# Patient Record
Sex: Male | Born: 1955 | Race: Black or African American | Hispanic: No | Marital: Married | Smoking: Never smoker
Health system: Southern US, Community
[De-identification: ages and names within clinical notes are randomized; demographics above are authoritative.]

## PROBLEM LIST (undated history)

## (undated) DIAGNOSIS — K08109 Complete loss of teeth, unspecified cause, unspecified class: Secondary | ICD-10-CM

## (undated) DIAGNOSIS — Z972 Presence of dental prosthetic device (complete) (partial): Secondary | ICD-10-CM

## (undated) DIAGNOSIS — E119 Type 2 diabetes mellitus without complications: Secondary | ICD-10-CM

## (undated) DIAGNOSIS — G629 Polyneuropathy, unspecified: Secondary | ICD-10-CM

## (undated) DIAGNOSIS — E785 Hyperlipidemia, unspecified: Secondary | ICD-10-CM

## (undated) DIAGNOSIS — I10 Essential (primary) hypertension: Secondary | ICD-10-CM

## (undated) DIAGNOSIS — K429 Umbilical hernia without obstruction or gangrene: Secondary | ICD-10-CM

## (undated) DIAGNOSIS — Z973 Presence of spectacles and contact lenses: Secondary | ICD-10-CM

## (undated) HISTORY — PX: ELBOW ARTHROSCOPY: SUR87

## (undated) HISTORY — DX: Hyperlipidemia, unspecified: E78.5

## (undated) HISTORY — DX: Type 2 diabetes mellitus without complications: E11.9

## (undated) HISTORY — DX: Polyneuropathy, unspecified: G62.9

---

## 1988-07-21 HISTORY — PX: CARDIAC CATHETERIZATION: SHX172

## 2006-01-03 ENCOUNTER — Emergency Department (HOSPITAL_COMMUNITY): Admission: EM | Admit: 2006-01-03 | Discharge: 2006-01-03 | Payer: Self-pay | Admitting: Emergency Medicine

## 2009-04-14 ENCOUNTER — Observation Stay (HOSPITAL_COMMUNITY): Admission: EM | Admit: 2009-04-14 | Discharge: 2009-04-15 | Payer: Self-pay | Admitting: Emergency Medicine

## 2009-04-14 ENCOUNTER — Ambulatory Visit: Payer: Self-pay | Admitting: Cardiology

## 2009-09-20 ENCOUNTER — Encounter: Admission: RE | Admit: 2009-09-20 | Discharge: 2009-09-20 | Payer: Self-pay | Admitting: Internal Medicine

## 2010-10-25 LAB — DIFFERENTIAL
Basophils Absolute: 0 10*3/uL (ref 0.0–0.1)
Basophils Relative: 1 % (ref 0–1)
Eosinophils Absolute: 0.5 10*3/uL (ref 0.0–0.7)
Eosinophils Relative: 9 % — ABNORMAL HIGH (ref 0–5)
Lymphocytes Relative: 43 % (ref 12–46)
Lymphs Abs: 2.3 10*3/uL (ref 0.7–4.0)
Monocytes Absolute: 0.7 10*3/uL (ref 0.1–1.0)
Monocytes Relative: 13 % — ABNORMAL HIGH (ref 3–12)
Neutro Abs: 1.9 10*3/uL (ref 1.7–7.7)
Neutrophils Relative %: 35 % — ABNORMAL LOW (ref 43–77)

## 2010-10-25 LAB — POCT I-STAT, CHEM 8
BUN: 9 mg/dL (ref 6–23)
Calcium, Ion: 1.11 mmol/L — ABNORMAL LOW (ref 1.12–1.32)
Chloride: 107 meq/L (ref 96–112)
Creatinine, Ser: 1.3 mg/dL (ref 0.4–1.5)
Glucose, Bld: 108 mg/dL — ABNORMAL HIGH (ref 70–99)
HCT: 47 % (ref 39.0–52.0)
Hemoglobin: 16 g/dL (ref 13.0–17.0)
Potassium: 3.7 meq/L (ref 3.5–5.1)
Sodium: 141 meq/L (ref 135–145)
TCO2: 23 mmol/L (ref 0–100)

## 2010-10-25 LAB — CBC
HCT: 44.2 % (ref 39.0–52.0)
HCT: 44.6 % (ref 39.0–52.0)
Hemoglobin: 15.3 g/dL (ref 13.0–17.0)
Hemoglobin: 15.6 g/dL (ref 13.0–17.0)
MCHC: 34.5 g/dL (ref 30.0–36.0)
MCHC: 34.8 g/dL (ref 30.0–36.0)
MCV: 87.3 fL (ref 78.0–100.0)
MCV: 87.5 fL (ref 78.0–100.0)
Platelets: 185 10*3/uL (ref 150–400)
Platelets: 205 10*3/uL (ref 150–400)
RBC: 5.05 MIL/uL (ref 4.22–5.81)
RBC: 5.11 MIL/uL (ref 4.22–5.81)
RDW: 12.5 % (ref 11.5–15.5)
RDW: 12.8 % (ref 11.5–15.5)
WBC: 5.4 10*3/uL (ref 4.0–10.5)
WBC: 7.1 10*3/uL (ref 4.0–10.5)

## 2010-10-25 LAB — TROPONIN I

## 2010-10-25 LAB — URINALYSIS, ROUTINE W REFLEX MICROSCOPIC
Bilirubin Urine: NEGATIVE
Glucose, UA: NEGATIVE mg/dL
Hgb urine dipstick: NEGATIVE
Ketones, ur: NEGATIVE mg/dL
Nitrite: NEGATIVE
Protein, ur: NEGATIVE mg/dL
Specific Gravity, Urine: 1.016 (ref 1.005–1.030)
Urobilinogen, UA: 0.2 mg/dL (ref 0.0–1.0)
pH: 7 (ref 5.0–8.0)

## 2010-10-25 LAB — BASIC METABOLIC PANEL WITH GFR
BUN: 8 mg/dL (ref 6–23)
CO2: 25 meq/L (ref 19–32)
Calcium: 8.9 mg/dL (ref 8.4–10.5)
Chloride: 104 meq/L (ref 96–112)
Creatinine, Ser: 1.24 mg/dL (ref 0.4–1.5)
GFR calc non Af Amer: >60 mL/min
Glucose, Bld: 116 mg/dL — ABNORMAL HIGH (ref 70–99)
Potassium: 3.5 meq/L (ref 3.5–5.1)
Sodium: 136 meq/L (ref 135–145)

## 2010-10-25 LAB — BASIC METABOLIC PANEL
BUN: 9 mg/dL (ref 6–23)
CO2: 24 mEq/L (ref 19–32)
Chloride: 107 mEq/L (ref 96–112)
Creatinine, Ser: 1.41 mg/dL (ref 0.4–1.5)
GFR calc non Af Amer: 52 mL/min — ABNORMAL LOW (ref >60)
Sodium: 139 mEq/L (ref 135–145)

## 2010-10-25 LAB — LIPID PANEL
Cholesterol: 219 mg/dL — ABNORMAL HIGH (ref 0–200)
HDL: 40 mg/dL (ref >39)
LDL Cholesterol: 128 mg/dL — ABNORMAL HIGH (ref 0–99)
Triglycerides: 257 mg/dL — ABNORMAL HIGH (ref <150)

## 2010-10-25 LAB — PROTIME-INR: Prothrombin Time: 12.4 seconds (ref 11.6–15.2)

## 2010-10-25 LAB — CK TOTAL AND CKMB (NOT AT ARMC)
CK, MB: 1.2 ng/mL (ref 0.3–4.0)
Relative Index: 0.7 (ref 0.0–2.5)
Total CK: 175 U/L (ref 7–232)

## 2010-10-25 LAB — CARDIAC PANEL(CRET KIN+CKTOT+MB+TROPI)
CK, MB: 0.9 ng/mL (ref 0.3–4.0)
Relative Index: 0.6 (ref 0.0–2.5)
Total CK: 143 U/L (ref 7–232)
Total CK: 167 U/L (ref 7–232)
Troponin I: 0.02 ng/mL (ref 0.00–0.06)

## 2010-10-25 LAB — POCT CARDIAC MARKERS
CKMB, poc: <1 ng/mL — ABNORMAL LOW (ref 1.0–8.0)
Myoglobin, poc: 97.1 ng/mL (ref 12–200)
Troponin i, poc: <0.05 ng/mL (ref 0.00–0.09)

## 2010-12-26 ENCOUNTER — Emergency Department (HOSPITAL_COMMUNITY)
Admission: EM | Admit: 2010-12-26 | Discharge: 2010-12-27 | Disposition: A | Discharge status: Home / Self Care | Payer: 59 | Attending: Emergency Medicine | Admitting: Emergency Medicine

## 2010-12-26 DIAGNOSIS — R109 Unspecified abdominal pain: Secondary | ICD-10-CM | POA: Insufficient documentation

## 2010-12-26 DIAGNOSIS — I10 Essential (primary) hypertension: Secondary | ICD-10-CM | POA: Insufficient documentation

## 2010-12-26 DIAGNOSIS — R197 Diarrhea, unspecified: Secondary | ICD-10-CM | POA: Insufficient documentation

## 2010-12-26 DIAGNOSIS — R112 Nausea with vomiting, unspecified: Secondary | ICD-10-CM | POA: Insufficient documentation

## 2010-12-26 LAB — CBC
HCT: 42.7 % (ref 39.0–52.0)
Hemoglobin: 15.1 g/dL (ref 13.0–17.0)
MCH: 30 pg (ref 26.0–34.0)
MCHC: 35.4 g/dL (ref 30.0–36.0)
MCV: 84.9 fL (ref 78.0–100.0)

## 2010-12-26 LAB — URINALYSIS, ROUTINE W REFLEX MICROSCOPIC
Glucose, UA: NEGATIVE mg/dL
Nitrite: NEGATIVE
Specific Gravity, Urine: 1.019 (ref 1.005–1.030)
pH: 6 (ref 5.0–8.0)

## 2010-12-26 LAB — COMPREHENSIVE METABOLIC PANEL
Alkaline Phosphatase: 47 U/L (ref 39–117)
BUN: 14 mg/dL (ref 6–23)
CO2: 26 mEq/L (ref 19–32)
Creatinine, Ser: 1.19 mg/dL (ref 0.4–1.5)
Glucose, Bld: 113 mg/dL — ABNORMAL HIGH (ref 70–99)
Sodium: 137 mEq/L (ref 135–145)
Total Protein: 7.2 g/dL (ref 6.0–8.3)

## 2010-12-26 LAB — DIFFERENTIAL
Basophils Absolute: 0 10*3/uL (ref 0.0–0.1)
Lymphocytes Relative: 21 % (ref 12–46)
Lymphs Abs: 1.1 10*3/uL (ref 0.7–4.0)
Monocytes Absolute: 0.6 10*3/uL (ref 0.1–1.0)
Neutro Abs: 3.4 10*3/uL (ref 1.7–7.7)
Neutrophils Relative %: 65 % (ref 43–77)

## 2010-12-26 LAB — LIPASE, BLOOD: Lipase: 23 U/L (ref 11–59)

## 2010-12-27 ENCOUNTER — Emergency Department (HOSPITAL_COMMUNITY): Payer: 59

## 2010-12-27 MED ORDER — IOHEXOL 300 MG/ML  SOLN
100.0000 mL | Freq: Once | INTRAMUSCULAR | Status: AC | PRN
  Administered 2010-12-27: 100 mL via INTRAVENOUS

## 2011-01-10 ENCOUNTER — Emergency Department (HOSPITAL_COMMUNITY)
Admission: EM | Admit: 2011-01-10 | Discharge: 2011-01-10 | Disposition: A | Discharge status: Home / Self Care | Payer: 59 | Attending: Emergency Medicine | Admitting: Emergency Medicine

## 2011-01-10 DIAGNOSIS — R10815 Periumbilic abdominal tenderness: Secondary | ICD-10-CM | POA: Insufficient documentation

## 2011-01-10 LAB — CBC
HCT: 38.3 % — ABNORMAL LOW (ref 39.0–52.0)
Hemoglobin: 13.3 g/dL (ref 13.0–17.0)
MCV: 84.7 fL (ref 78.0–100.0)
Platelets: 219 10*3/uL (ref 150–400)
RBC: 4.52 MIL/uL (ref 4.22–5.81)
WBC: 4.5 10*3/uL (ref 4.0–10.5)

## 2011-01-10 LAB — URINALYSIS, ROUTINE W REFLEX MICROSCOPIC
Bilirubin Urine: NEGATIVE
Glucose, UA: NEGATIVE mg/dL
Nitrite: NEGATIVE
Specific Gravity, Urine: 1.008 (ref 1.005–1.030)
pH: 7.5 (ref 5.0–8.0)

## 2011-01-10 LAB — DIFFERENTIAL
Eosinophils Absolute: 0.2 10*3/uL (ref 0.0–0.7)
Lymphocytes Relative: 41 % (ref 12–46)
Lymphs Abs: 1.8 10*3/uL (ref 0.7–4.0)
Neutro Abs: 2 10*3/uL (ref 1.7–7.7)
Neutrophils Relative %: 44 % (ref 43–77)

## 2011-01-10 LAB — POCT I-STAT, CHEM 8
BUN: 13 mg/dL (ref 6–23)
Chloride: 105 mEq/L (ref 96–112)
HCT: 40 % (ref 39.0–52.0)
Potassium: 4.6 mEq/L (ref 3.5–5.1)
Sodium: 139 mEq/L (ref 135–145)

## 2011-01-10 LAB — HEPATIC FUNCTION PANEL
AST: 29 U/L (ref 0–37)
Albumin: 3.7 g/dL (ref 3.5–5.2)
Total Bilirubin: 0.3 mg/dL (ref 0.3–1.2)

## 2011-12-10 ENCOUNTER — Ambulatory Visit (INDEPENDENT_AMBULATORY_CARE_PROVIDER_SITE_OTHER): Payer: 59 | Admitting: Family Medicine

## 2011-12-10 ENCOUNTER — Telehealth: Payer: Self-pay | Admitting: Family Medicine

## 2011-12-10 ENCOUNTER — Ambulatory Visit: Payer: 59

## 2011-12-10 DIAGNOSIS — R0602 Shortness of breath: Secondary | ICD-10-CM

## 2011-12-10 DIAGNOSIS — R079 Chest pain, unspecified: Secondary | ICD-10-CM

## 2011-12-10 DIAGNOSIS — I10 Essential (primary) hypertension: Secondary | ICD-10-CM

## 2011-12-10 DIAGNOSIS — J209 Acute bronchitis, unspecified: Secondary | ICD-10-CM

## 2011-12-10 LAB — POCT CBC
Lymph, poc: 2.2 (ref 0.6–3.4)
MCH, POC: 28.9 pg (ref 27–31.2)
MCHC: 32.7 g/dL (ref 31.8–35.4)
MID (cbc): 0.5 (ref 0–0.9)
MPV: 10.5 fL (ref 0–99.8)
POC MID %: 7.9 %M (ref 0–12)
Platelet Count, POC: 195 10*3/uL (ref 142–424)
WBC: 6.5 10*3/uL (ref 4.6–10.2)

## 2011-12-10 MED ORDER — ALBUTEROL SULFATE HFA 108 (90 BASE) MCG/ACT IN AERS: 1.0000 | INHALATION_SPRAY | Freq: Four times a day (QID) | RESPIRATORY_TRACT | Status: DC | PRN

## 2011-12-10 MED ORDER — AZITHROMYCIN 250 MG PO TABS: ORAL_TABLET | ORAL | Status: AC

## 2011-12-10 MED ORDER — ALBUTEROL SULFATE (2.5 MG/3ML) 0.083% IN NEBU
2.5000 mg | INHALATION_SOLUTION | Freq: Once | RESPIRATORY_TRACT | Status: AC
Start: 2011-12-10 — End: 2011-12-10
  Administered 2011-12-10: 2.5 mg via RESPIRATORY_TRACT

## 2011-12-10 MED ORDER — CHLORTHALIDONE 25 MG PO TABS: 25.0000 mg | ORAL_TABLET | Freq: Every day | ORAL | Status: DC

## 2011-12-10 MED ORDER — IPRATROPIUM BROMIDE 0.02 % IN SOLN
0.5000 mg | Freq: Once | RESPIRATORY_TRACT | Status: AC
  Administered 2011-12-10: 0.5 mg via RESPIRATORY_TRACT

## 2011-12-10 MED ORDER — CEFTRIAXONE SODIUM 1 G IJ SOLR
1.0000 g | INTRAMUSCULAR | Status: DC
  Administered 2011-12-10: 1 g via INTRAMUSCULAR

## 2011-12-10 MED ORDER — HYDROCODONE-HOMATROPINE 5-1.5 MG/5ML PO SYRP: 5.0000 mL | ORAL_SOLUTION | Freq: Three times a day (TID) | ORAL | Status: AC | PRN

## 2011-12-10 NOTE — Progress Notes (Signed)
  Subjective:    Patient ID: Jacob Whitaker, male    DOB: May 13, 1956, 56 y.o.   MRN: [id number]  HPI  Patient presents after developing cough productive of clear mucus on Sunday Chills  Slight SOB associated with wheezing this morning.  Since coughing developed (L) sided CP that is constant   Given patients symptoms his co workers were concerned that he was having an MI and called EMS. Patient placed on monitor and advised to follow up with his medical doctor.   PMH/ HTN on ACE           GERD           Seasonal allergies Review of Systems     Objective:   Physical Exam  Constitutional: He appears well-developed.  HENT:  Nose: Mucosal edema and rhinorrhea present.  Neck: Neck supple. No thyromegaly present.  Cardiovascular: Normal rate, regular rhythm and normal heart sounds.   Pulmonary/Chest: Effort normal.       Diffuse crackles on exam  Abdominal: Bowel sounds are normal.  Neurological: He is alert.  Skin: Skin is warm.     [facility] reading (PRIMARY) by  Dr. Hal Hope Increase interstitial markings (B) RLL infiltrate   EKG- NSR; no ischemic changes  Patient received albuterol and atrovent via [facility] After nebulized therapy CTA (B) without crackles    Assessment & Plan:   1. Chest pain secondary to cough    2. Acute bronchitis  POCT CBC, EKG 12-Lead, DG Chest 2 View, Brain natriuretic peptide, albuterol (PROVENTIL) (2.5 MG/3ML) 0.083% nebulizer solution 2.5 mg, ipratropium (ATROVENT) nebulizer solution 0.5 mg, Peak flow, cefTRIAXone (ROCEPHIN) injection 1 g, azithromycin (ZITHROMAX) 250 MG tablet, albuterol (PROVENTIL HFA;VENTOLIN HFA) 108 (90 BASE) MCG/ACT inhaler, HYDROcodone-homatropine (HYCODAN) 5-1.5 MG/5ML syrup  3. HTN (hypertension)  POCT glucose (manual entry), chlorthalidone (HYGROTON) 25 MG tablet; encouraged compliance with antihypertensive therapy   24 hour follow up Medications at [facility]

## 2011-12-12 ENCOUNTER — Encounter: Payer: Self-pay | Admitting: Family Medicine

## 2011-12-12 ENCOUNTER — Ambulatory Visit (INDEPENDENT_AMBULATORY_CARE_PROVIDER_SITE_OTHER): Payer: 59 | Admitting: Family Medicine

## 2011-12-12 VITALS — BP 160/96 | HR 72 | Temp 98.7°F | Resp 18 | Ht 67.0 in | Wt 240.0 lb

## 2011-12-12 DIAGNOSIS — R05 Cough: Secondary | ICD-10-CM

## 2011-12-12 DIAGNOSIS — J40 Bronchitis, not specified as acute or chronic: Secondary | ICD-10-CM

## 2011-12-12 DIAGNOSIS — I10 Essential (primary) hypertension: Secondary | ICD-10-CM

## 2011-12-12 DIAGNOSIS — R059 Cough, unspecified: Secondary | ICD-10-CM

## 2011-12-12 MED ORDER — MOMETASONE FURO-FORMOTEROL FUM 100-5 MCG/ACT IN AERO: 1.0000 | INHALATION_SPRAY | Freq: Two times a day (BID) | RESPIRATORY_TRACT | Status: DC

## 2011-12-12 MED ORDER — CARVEDILOL 3.125 MG PO TABS: 3.1250 mg | ORAL_TABLET | Freq: Two times a day (BID) | ORAL | Status: DC

## 2011-12-12 NOTE — Progress Notes (Signed)
  Subjective:    Patient ID: Jacob Whitaker, male    DOB: [birth year], 56 y.o.   MRN: [id number]  HPI  Patient presents in follow up of OV 12/10/11 Overall patient is beginning to feel better. Non productive ough still persists as well as tactile fever and chills.  CXR reading was consistent with bronchitis WBC = normal EKG = normal BNP 111; I stared patient on cholrthalidone   Patient states he forgot to take BP medication this am and can miss pills on a weekly basis. 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  Review of Systems     Objective:   Physical Exam  Constitutional: He appears well-developed.  HENT:  Mouth/Throat: Oropharynx is clear and moist.       TM's retracted  Neck: Normal range of motion. Neck supple.  Cardiovascular: Normal rate, regular rhythm and normal heart sounds.   Pulmonary/Chest: Effort normal and breath sounds normal. He has no wheezes.       improved air movement; crackles resolved  Abdominal: Soft. Bowel sounds are normal.  Neurological: He is alert.  Skin: Skin is warm.       No edema    EKG no change from EKG 12/10/11      Assessment & Plan:   1. Cough  Hold ACE until aftter acute illness resolves  2. Bronchitis  mometasone-formoterol (DULERA) 100-5 MCG/ACT AERO  3. HTN (hypertension); slightly elevated BNP(111) carvedilol (COREG) 3.125 MG tablet; Cholthalidone 25 mg   Encouraged adherence with antihypertensive therapy. Continue with low sodium diet and regular exercise. Follow up 3 weeks for BP check

## 2011-12-12 NOTE — Patient Instructions (Signed)
1) Asthmatic bronchitis  Zithromax Dulera 1 puff twice daily  2) Cough  Continue on Prevacid Hold lisinopril as this medication frequently can cause cough  3) Hypertension  Coreg 3.125 mg twice daily Chlorthalidone once daily Once illness resolves can resume lisinopril  OK to return to work

## 2011-12-19 ENCOUNTER — Telehealth: Payer: Self-pay

## 2012-01-25 ENCOUNTER — Telehealth: Payer: Self-pay

## 2012-01-25 DIAGNOSIS — I10 Essential (primary) hypertension: Secondary | ICD-10-CM

## 2012-01-25 MED ORDER — CARVEDILOL 3.125 MG PO TABS: 3.1250 mg | ORAL_TABLET | Freq: Two times a day (BID) | ORAL | Status: DC

## 2012-01-25 NOTE — Telephone Encounter (Signed)
Pt is needing to get a refill on carvedilol 3.125mg  at [facility] [place] [phone]  Not sure if he needs an ov first  Please call [phone]

## 2012-01-25 NOTE — Telephone Encounter (Signed)
PT NOTIFIED THAT RX FOR 1 MONTH SENT IN AND THAT HE NEEDS AN OFFICE VISIT BEFORE ANYMORE REFILLS

## 2012-01-27 NOTE — Telephone Encounter (Signed)
error 

## 2012-04-04 NOTE — Telephone Encounter (Signed)
DONE

## 2013-01-24 ENCOUNTER — Encounter: Payer: Self-pay | Admitting: Family Medicine

## 2013-01-24 ENCOUNTER — Ambulatory Visit (INDEPENDENT_AMBULATORY_CARE_PROVIDER_SITE_OTHER): Payer: 59 | Admitting: Family Medicine

## 2013-01-24 VITALS — BP 130/80 | HR 60 | Temp 98.1°F | Resp 16 | Ht 67.5 in | Wt 241.0 lb

## 2013-01-24 DIAGNOSIS — M6530 Trigger finger, unspecified finger: Secondary | ICD-10-CM

## 2013-01-24 NOTE — Patient Instructions (Addendum)
Trigger Finger  Trigger finger (digital tendinitis and stenosing tenosynovitis) is a common disorder that causes an often painful catching of the fingers or thumb. It occurs as a clicking, snapping or locking of a finger in the palm of the hand. The reason for this is that there is a problem with the tendons which flex the fingers sliding smoothly through their sheaths. The cause of this may be inflammation of the tendon and sheath, or from a thickening or nodule in the tendon. The condition may occur in any finger or a couple fingers at the same time. The cause may be overuse while doing the same activity over and over again with your hands.   Tendons are the tough cords that connect the muscles to bones. Muscles and tendons are part of the system which allows your body to move. When muscles contract in the forearm on the palm side, they pull the tendons toward the elbow and cause the fingers and thumb to bend (flex) toward the palm. These are the flexor tendons. The tendons slide through a slippery smooth membrane (synovium) which is called the tendon sheath. The sheaths have areas of tough fibrous tissues surrounding them which hold the tendons close to the bone. These are called pulleys because they work like a pulley. The first pulley is in the palm of the hand near the crease which runs across your palm. If the area of the tendon thickening is near the pulley, the tendon cannot slide smoothly through the pulley and this causes the trigger finger.  The finger may lock with the finger curled or suddenly straighten out with a snap. This is more common in patients with rheumatoid arthritis and diabetes. Left untreated, the condition may get worse to the point where the finger becomes locked in flexion, like making a fist, or less commonly locked with the finger straightened out.  DIAGNOSIS   Your caregiver will easily make this diagnosis on examination.  TREATMENT   · Splinting for 6 to 8 weeks of time may be  helpful. Use the splints as your caregiver suggests.  · Heat used for twenty minutes at least four times a day followed by ice packs for twenty minutes unless directed otherwise by your caregiver may be helpful. If you find either heat or cold seems to be making the problem worse, quit using them and ask your caregiver for directions.  · Cortisone injections along with splinting may speed up recovery. Several injections may be required. Cortisone may give relief after one injection.  · Only take over-the-counter or prescription medicines for pain, discomfort, or fever as directed by your caregiver.  · Surgery is another treatment that may be used if conservative treatments using injection and splinting does not work. Surgery can be minor without incisions (a cut does not have to be made) and can be done with a needle through the skin. No stitches are needed and most patients may return to work the same day.  · Other surgical choices involve an open procedure where the surgeon opens the hand through a small incision (cut) and cuts the pulley so the tendon can again slide smoothly. Your hand will still work fine. This small operation requires stitches and the recovery will be a little longer and the incisions will need to be protected until completely healed. You may have to limit your activities for up to 6 months.  · Occupational or hand therapy may be required if there is stiffness remaining in the finger.    RISKS AND COMPLICATIONS  Complications are uncommon but some problems that may occur are:  · Recurrence of the trigger finger. This does not mean that the surgery was not well done. It simply means that you may have formed scar tissue following surgery that causes the problem to reoccur.  · Infection which could ruin the results of the surgery and can result in a finger which is frozen and can not move normally.  · Nerve injury is possible which could result in permanent numbness of one or more fingers.  CARE  AFTER SURGERY  · Elevate your hand above your heart and use ice as instructed.  · Follow instructions regarding finger motion/exercise.  · Keep the surgical wound dry for at least 48 hrs or longer if instructed.  · Keep your follow-up appointments.  · Return to work and normal activities as instructed.  SEEK IMMEDIATE MEDICAL CARE IF:   Your problems are getting worse or you do not obtain relief from the treatment.  Document Released: 04/26/2004 Document Revised: 09/29/2011 Document Reviewed: 12/19/2008  Exit[employer]® Patient Information ©2014 [employer].

## 2013-01-24 NOTE — Progress Notes (Signed)
This 57 year old man who works for [employer] and has had problems for some time with his right thumb sticking open or closed. Today the some became impossible to flex all the way into his palm.  Objective: Patient has limited range of motion with a fixed extension present.  Assessment: Trigger thumb  Plan: Hand referral  Signed, Sheila Oats.D.

## 2013-03-21 DIAGNOSIS — K429 Umbilical hernia without obstruction or gangrene: Secondary | ICD-10-CM

## 2013-03-21 HISTORY — DX: Umbilical hernia without obstruction or gangrene: K42.9

## 2013-03-28 ENCOUNTER — Emergency Department (HOSPITAL_COMMUNITY): Payer: PRIVATE HEALTH INSURANCE

## 2013-03-28 ENCOUNTER — Observation Stay (HOSPITAL_COMMUNITY)
Admission: EM | Admit: 2013-03-28 | Discharge: 2013-03-29 | Disposition: A | Discharge status: Home / Self Care | Payer: PRIVATE HEALTH INSURANCE | Attending: Surgery | Admitting: Surgery

## 2013-03-28 ENCOUNTER — Encounter (HOSPITAL_COMMUNITY): Payer: Self-pay | Admitting: Emergency Medicine

## 2013-03-28 DIAGNOSIS — K429 Umbilical hernia without obstruction or gangrene: Secondary | ICD-10-CM

## 2013-03-28 DIAGNOSIS — K420 Umbilical hernia with obstruction, without gangrene: Principal | ICD-10-CM | POA: Insufficient documentation

## 2013-03-28 DIAGNOSIS — R109 Unspecified abdominal pain: Secondary | ICD-10-CM | POA: Diagnosis present

## 2013-03-28 DIAGNOSIS — I10 Essential (primary) hypertension: Secondary | ICD-10-CM

## 2013-03-28 DIAGNOSIS — K56609 Unspecified intestinal obstruction, unspecified as to partial versus complete obstruction: Secondary | ICD-10-CM

## 2013-03-28 HISTORY — DX: Essential (primary) hypertension: I10

## 2013-03-28 HISTORY — DX: Umbilical hernia without obstruction or gangrene: K42.9

## 2013-03-28 LAB — CBC WITH DIFFERENTIAL/PLATELET
Eosinophils Absolute: 0.3 10*3/uL (ref 0.0–0.7)
Eosinophils Relative: 5 % (ref 0–5)
HCT: 43.3 % (ref 39.0–52.0)
Lymphocytes Relative: 53 % — ABNORMAL HIGH (ref 12–46)
Monocytes Relative: 10 % (ref 3–12)
Neutro Abs: 1.7 10*3/uL (ref 1.7–7.7)
Neutrophils Relative %: 31 % — ABNORMAL LOW (ref 43–77)
RDW: 13.3 % (ref 11.5–15.5)
WBC: 5.5 10*3/uL (ref 4.0–10.5)

## 2013-03-28 LAB — COMPREHENSIVE METABOLIC PANEL
BUN: 12 mg/dL (ref 6–23)
Calcium: 9.6 mg/dL (ref 8.4–10.5)
GFR calc Af Amer: 72 mL/min — ABNORMAL LOW (ref >90)
Glucose, Bld: 144 mg/dL — ABNORMAL HIGH (ref 70–99)
Total Protein: 7.3 g/dL (ref 6.0–8.3)

## 2013-03-28 LAB — CBC
Hemoglobin: 15.5 g/dL (ref 13.0–17.0)
MCH: 30.9 pg (ref 26.0–34.0)
MCV: 86.4 fL (ref 78.0–100.0)
Platelets: 214 10*3/uL (ref 150–400)
RBC: 5.01 MIL/uL (ref 4.22–5.81)

## 2013-03-28 LAB — URINALYSIS, ROUTINE W REFLEX MICROSCOPIC
Nitrite: NEGATIVE
Specific Gravity, Urine: 1.022 (ref 1.005–1.030)
Urobilinogen, UA: 1 mg/dL (ref 0.0–1.0)

## 2013-03-28 LAB — LIPASE, BLOOD: Lipase: 28 U/L (ref 11–59)

## 2013-03-28 LAB — TROPONIN I: Troponin I: <0.3 ng/mL (ref <0.30)

## 2013-03-28 MED ORDER — OXYCODONE HCL 5 MG PO TABS
5.0000 mg | ORAL_TABLET | ORAL | Status: DC | PRN
  Administered 2013-03-29: 5 mg via ORAL
  Filled 2013-03-28: qty 1

## 2013-03-28 MED ORDER — DIPHENHYDRAMINE HCL 12.5 MG/5ML PO ELIX: 12.5000 mg | ORAL_SOLUTION | Freq: Four times a day (QID) | ORAL | Status: DC | PRN

## 2013-03-28 MED ORDER — ONDANSETRON HCL 4 MG/2ML IJ SOLN
4.0000 mg | Freq: Once | INTRAMUSCULAR | Status: AC
  Administered 2013-03-28: 4 mg via INTRAVENOUS
  Filled 2013-03-28: qty 2

## 2013-03-28 MED ORDER — LISINOPRIL 10 MG PO TABS
10.0000 mg | ORAL_TABLET | Freq: Every day | ORAL | Status: DC
  Filled 2013-03-28: qty 1

## 2013-03-28 MED ORDER — MOMETASONE FURO-FORMOTEROL FUM 100-5 MCG/ACT IN AERO
1.0000 | INHALATION_SPRAY | Freq: Two times a day (BID) | RESPIRATORY_TRACT | Status: DC
  Administered 2013-03-28 – 2013-03-29 (×2): 1 via RESPIRATORY_TRACT
  Filled 2013-03-28: qty 8.8

## 2013-03-28 MED ORDER — CHLORTHALIDONE 25 MG PO TABS
25.0000 mg | ORAL_TABLET | Freq: Every day | ORAL | Status: DC
  Administered 2013-03-28 – 2013-03-29 (×2): 25 mg via ORAL
  Filled 2013-03-28 (×2): qty 1

## 2013-03-28 MED ORDER — ONDANSETRON HCL 4 MG/2ML IJ SOLN
4.0000 mg | Freq: Four times a day (QID) | INTRAMUSCULAR | Status: DC | PRN
  Administered 2013-03-29: 4 mg via INTRAVENOUS
  Filled 2013-03-28: qty 2

## 2013-03-28 MED ORDER — HEPARIN SODIUM (PORCINE) 5000 UNIT/ML IJ SOLN
5000.0000 [IU] | Freq: Three times a day (TID) | INTRAMUSCULAR | Status: DC
  Administered 2013-03-28 – 2013-03-29 (×2): 5000 [IU] via SUBCUTANEOUS
  Filled 2013-03-28 (×4): qty 1

## 2013-03-28 MED ORDER — ACETAMINOPHEN 325 MG PO TABS: 650.0000 mg | ORAL_TABLET | Freq: Four times a day (QID) | ORAL | Status: DC | PRN

## 2013-03-28 MED ORDER — SODIUM CHLORIDE 0.9 % IV BOLUS (SEPSIS)
500.0000 mL | Freq: Once | INTRAVENOUS | Status: AC
  Administered 2013-03-28: 500 mL via INTRAVENOUS

## 2013-03-28 MED ORDER — IOHEXOL 300 MG/ML  SOLN
25.0000 mL | INTRAMUSCULAR | Status: AC
  Administered 2013-03-28: 25 mL via ORAL

## 2013-03-28 MED ORDER — MORPHINE SULFATE 4 MG/ML IJ SOLN
4.0000 mg | Freq: Once | INTRAMUSCULAR | Status: AC
  Administered 2013-03-28: 4 mg via INTRAVENOUS
  Filled 2013-03-28: qty 1

## 2013-03-28 MED ORDER — ALBUTEROL SULFATE HFA 108 (90 BASE) MCG/ACT IN AERS: 1.0000 | INHALATION_SPRAY | Freq: Four times a day (QID) | RESPIRATORY_TRACT | Status: DC | PRN

## 2013-03-28 MED ORDER — MORPHINE SULFATE 2 MG/ML IJ SOLN: 1.0000 mg | INTRAMUSCULAR | Status: DC | PRN

## 2013-03-28 MED ORDER — ACETAMINOPHEN 650 MG RE SUPP: 650.0000 mg | Freq: Four times a day (QID) | RECTAL | Status: DC | PRN

## 2013-03-28 MED ORDER — KCL IN DEXTROSE-NACL 20-5-0.45 MEQ/L-%-% IV SOLN
INTRAVENOUS | Status: DC
  Administered 2013-03-28: 17:00:00 via INTRAVENOUS
  Filled 2013-03-28 (×2): qty 1000

## 2013-03-28 MED ORDER — DIPHENHYDRAMINE HCL 50 MG/ML IJ SOLN: 12.5000 mg | Freq: Four times a day (QID) | INTRAMUSCULAR | Status: DC | PRN

## 2013-03-28 MED ORDER — PANTOPRAZOLE SODIUM 40 MG PO TBEC
40.0000 mg | DELAYED_RELEASE_TABLET | Freq: Every day | ORAL | Status: DC
  Administered 2013-03-28 – 2013-03-29 (×2): 40 mg via ORAL
  Filled 2013-03-28 (×2): qty 1

## 2013-03-28 MED ORDER — CARVEDILOL 3.125 MG PO TABS
3.1250 mg | ORAL_TABLET | Freq: Two times a day (BID) | ORAL | Status: DC
  Administered 2013-03-28 – 2013-03-29 (×2): 3.125 mg via ORAL
  Filled 2013-03-28 (×4): qty 1

## 2013-03-28 MED ORDER — IOHEXOL 300 MG/ML  SOLN
100.0000 mL | Freq: Once | INTRAMUSCULAR | Status: AC | PRN
  Administered 2013-03-28: 100 mL via INTRAVENOUS

## 2013-03-28 NOTE — H&P (Signed)
I have seen and examined the patient and agree with the assessment and plans. We will observe him just to make sure his bowel is ok, and if so, will plan his repair with mesh as an outpt  Cailyn Houdek A. Magnus Ivan  MD, FACS

## 2013-03-28 NOTE — H&P (Signed)
Jacob Whitaker 1956/03/09  [id number].   Chief Complaint/Reason for Consult: abdominal pain, umbilical hernia HPI: This is a 57 yo black male with a h/o HTN who began having severe abdominal pain this morning at 0500am.  It was in his lower abdomen.  He developed nausea and vomiting.  Due to pain, he present to [facility].  Upon arrival, he had an acute abdominal series which was unremarkable.  He then had a CT scan that revealed a SBO secondary to an incarcerated umbilical hernia.  The EDP reduced the hernia, however still called [place] for evaluation.  Currently, he is feeling better with less nausea and less pain.  Review of Systems: Please see HPI, otherwise all other systems have been reviewed and are negative  History reviewed. No pertinent family history.  Past Medical History  Diagnosis Date  . Hypertension     Past Surgical History  Procedure Laterality Date  . Elbow arthroscopy Left     Social History:  reports that he has never smoked. He does not have any smokeless tobacco history on file. He reports that he does not drink alcohol or use illicit drugs.  Allergies: No Known Allergies   (Not in a hospital admission)  Blood pressure 114/70, pulse 72, temperature 98 F (36.7 C), temperature source Oral, resp. rate 10, SpO2 97.00%. Physical Exam: General: pleasant, WD, WN black male who is laying in bed in NAD HEENT: head is normocephalic, atraumatic.  Sclera are noninjected.  PERRL.  Ears and nose without any masses or lesions.  Mouth is pink and moist Heart: regular, rate, and rhythm.  Normal s1,s2. No obvious murmurs, gallops, or rubs noted.  Palpable radial and pedal pulses bilaterally Lungs: CTAB, no wheezes, rhonchi, or rales noted.  Respiratory effort nonlabored Abd: soft, NT, ND, +BS, no masses or organomegaly.  Small reducible umbilical hernia.  Defect is palpable MS: all 4 extremities are symmetrical with no cyanosis, clubbing, or edema. Skin: warm and dry with no masses,  lesions, or rashes Psych: A&Ox3 with an appropriate affect.    Results for orders placed during the hospital encounter of 03/28/13 (from the past 48 hour(s))  CBC WITH DIFFERENTIAL     Status: Abnormal   Collection Time    03/28/13  8:06 AM      Result Value Range   WBC 5.5  4.0 - 10.5 K/uL   RBC 5.07  4.22 - 5.81 MIL/uL   Hemoglobin 15.8  13.0 - 17.0 g/dL   HCT 40.9  81.1 - 91.4 %   MCV 85.4  78.0 - 100.0 fL   MCH 31.2  26.0 - 34.0 pg   MCHC 36.5 (*) 30.0 - 36.0 g/dL   RDW 78.2  95.6 - 21.3 %   Platelets 191  150 - 400 K/uL   Neutrophils Relative % 31 (*) 43 - 77 %   Neutro Abs 1.7  1.7 - 7.7 K/uL   Lymphocytes Relative 53 (*) 12 - 46 %   Lymphs Abs 3.0  0.7 - 4.0 K/uL   Monocytes Relative 10  3 - 12 %   Monocytes Absolute 0.5  0.1 - 1.0 K/uL   Eosinophils Relative 5  0 - 5 %   Eosinophils Absolute 0.3  0.0 - 0.7 K/uL   Basophils Relative 1  0 - 1 %   Basophils Absolute 0.1  0.0 - 0.1 K/uL  COMPREHENSIVE METABOLIC PANEL     Status: Abnormal   Collection Time    03/28/13  8:06 AM  Result Value Range   Sodium 137  135 - 145 mEq/L   Potassium 3.3 (*) 3.5 - 5.1 mEq/L   Chloride 100  96 - 112 mEq/L   CO2 24  19 - 32 mEq/L   Glucose, Bld 144 (*) 70 - 99 mg/dL   BUN 12  6 - 23 mg/dL   Creatinine, Ser 1.61  0.50 - 1.35 mg/dL   Calcium 9.6  8.4 - 09.6 mg/dL   Total Protein 7.3  6.0 - 8.3 g/dL   Albumin 4.2  3.5 - 5.2 g/dL   AST 33  0 - 37 U/L   ALT 53  0 - 53 U/L   Alkaline Phosphatase 44  39 - 117 U/L   Total Bilirubin 0.3  0.3 - 1.2 mg/dL   GFR calc non Af Amer 62 (*) >90 mL/min   GFR calc Af Amer 72 (*) >90 mL/min   Comment: (NOTE)     The eGFR has been calculated using the CKD EPI equation.     This calculation has not been validated in all clinical situations.     eGFR's persistently <90 mL/min signify possible Chronic Kidney     Disease.  LIPASE, BLOOD     Status: None   Collection Time    03/28/13  8:06 AM      Result Value Range   Lipase 28  11 - 59 U/L   TROPONIN I     Status: None   Collection Time    03/28/13  8:08 AM      Result Value Range   Troponin I <0.30  <0.30 ng/mL   Comment:            Due to the release kinetics of cTnI,     a negative result within the first hours     of the onset of symptoms does not rule out     myocardial infarction with certainty.     If myocardial infarction is still suspected,     repeat the test at appropriate intervals.  URINALYSIS, ROUTINE W REFLEX MICROSCOPIC     Status: None   Collection Time    03/28/13  9:06 AM      Result Value Range   Color, Urine YELLOW  YELLOW   APPearance CLEAR  CLEAR   Specific Gravity, Urine 1.022  1.005 - 1.030   pH 7.0  5.0 - 8.0   Glucose, UA NEGATIVE  NEGATIVE mg/dL   Hgb urine dipstick NEGATIVE  NEGATIVE   Bilirubin Urine NEGATIVE  NEGATIVE   Ketones, ur NEGATIVE  NEGATIVE mg/dL   Protein, ur NEGATIVE  NEGATIVE mg/dL   Urobilinogen, UA 1.0  0.0 - 1.0 mg/dL   Nitrite NEGATIVE  NEGATIVE   Leukocytes, UA NEGATIVE  NEGATIVE   Comment: MICROSCOPIC NOT DONE ON URINES WITH NEGATIVE PROTEIN, BLOOD, LEUKOCYTES, NITRITE, OR GLUCOSE <1000 mg/dL.   Ct Abdomen Pelvis W Contrast  03/28/2013   *RADIOLOGY REPORT*  Clinical Data: Severe lower abdominal pain with nausea  CT ABDOMEN AND PELVIS WITH CONTRAST  Technique:  Multidetector CT imaging of the abdomen and pelvis was performed following the standard protocol during bolus administration of intravenous contrast.  Contrast: OMNIPAQUE IOHEXOL 300 MG/ML  SOLN  Comparison: 12/27/2010  Findings:  Lung bases are unremarkable.  Mild elevation of the left hemidiaphragm.  No destructive bony lesions are noted.  Mild degenerative changes lumbar spine.  Mild hepatic fatty infiltration.  No calcified gallstones are noted within gallbladder.  No aortic  aneurysm.  The pancreas, spleen and adrenal glands are unremarkable.  No focal hepatic mass.  Bilateral kidneys mildly dilated contour.  Normal bilateral renal enhancement.  No  hydronephrosis or hydroureter.  Some stool noted in the right colon.  No pericecal inflammation.  Normal appendix is clearly visualized axial image 54.  Scattered colonic diverticula are noted.  No evidence of acute diverticulitis.  Mild enlarged prostate gland.  Prostate gland measures 5 x 4 cm.  No destructive bony lesions are noted within pelvis.  The urinary bladder is unremarkable.  There is a small umbilical hernia in axial image 63.  The hernia is containing fat and incarcerated small bowel loop.  Contrast material is noted within small bowel up to the level of the hernia. No any contrast material is noted in the small bowel exiting the hernia.  Findings are consistent with small bowel obstruction due to strangulated umbilical hernia.  The hernia measures 2.5 x 3.3 cm.  Best visualized in the sagittal image 102.  Distal small bowel is small caliber collapsed.  The terminal ileum is unremarkable.  Sigmoid colon diverticula. No evidence of acute diverticulitis.  Nonspecific bilateral inguinal lymph nodes are noted.  No destructive bony lesions are noted within pelvis.  Delayed renal images shows bilateral renal symmetrical excretion. Bilateral visualized proximal ureter is unremarkable.  IMPRESSION:  1.  There is a small umbilical hernia containing fat and strangulated small bowel loop as seen in axial image 64.  This is causing small bowel obstruction. 2.  Scattered colonic diverticula.  No evidence of acute diverticulitis. 3.  No pericecal inflammation.  Normal appendix. 4.  Mild hepatic fatty infiltration. 5.  No hydronephrosis or hydroureter.  Critical findings discussed with Dr. Rubin Payor   Original Report Authenticated By: Natasha Mead, M.D.   Dg Abd Acute W/chest  03/28/2013   *RADIOLOGY REPORT*  Clinical Data: Abdominal pain, nausea, vomiting, diarrhea, history hypertension  ACUTE ABDOMEN SERIES (ABDOMEN 2 VIEW & CHEST 1 VIEW)  Comparison: Chest radiographs 12/10/2011  Findings: Normal heart size,  mediastinal contours, and pulmonary vascularity. Peribronchial thickening with mild bibasilar atelectasis greater on left. Lungs otherwise clear. No pleural effusion and pneumothorax. Paucity of bowel gas. Scattered stool throughout proximal half of colon. No bowel dilatation, bowel wall thickening or free intraperitoneal air. Osseous structures unremarkable. No pathologic calcifications.  IMPRESSION: Mild bibasilar atelectasis, greater on left.   Original Report Authenticated By: Ulyses Southward, M.D.       Assessment/Plan 1. SBO secondary to umbilical hernia, now reduced 2. HTN  Plan: 1. Will admit for observation to assure his SBO has resolved since his hernia seems to be reduced.  If it does not improve, he may need a dx laparoscopy tomorrow to evaluate his hernia and bowel obstruction.  Zaakirah Kistner E 03/28/2013, 1:36 PM Pager: [phone]

## 2013-03-28 NOTE — ED Notes (Signed)
Family at bedside. 

## 2013-03-28 NOTE — ED Notes (Signed)
Patient transported to CT 

## 2013-03-28 NOTE — ED Provider Notes (Signed)
CSN: [id number]     Arrival date & time 03/28/13  5621 History   First MD Initiated Contact with Patient 03/28/13 [phone]     Chief Complaint  Patient presents with  . Abdominal Pain  . Chest Pain   (Consider location/radiation/quality/duration/timing/severity/associated sxs/prior Treatment) Patient is a 57 y.o. male presenting with abdominal pain and chest pain. The history is provided by the patient.  Abdominal Pain Associated symptoms: chest pain and nausea   Associated symptoms: no diarrhea, no shortness of breath and no vomiting   Chest Pain Associated symptoms: abdominal pain and nausea   Associated symptoms: no back pain, no headache, no numbness, no shortness of breath, not vomiting and no weakness    patient presents with abdominal pain that began around 5:30 this morning. It is severe and crampy in over his whole abdomen. He's had nausea without vomiting. He had some slight diarrhea over the last couple days. No fevers. No cough. No blood in emesis or stool. No fevers. He states he had a similar symptom a few years ago and no cause was found. No dysuria.  He also had some dull chest pain for last couple days. It comes and goes. It is not associated with exertion. He'll have occasional cough. It seems to be slightly worse with breathing.   Past Medical History  Diagnosis Date  . Hypertension   . Hernia, umbilical 03/2013   Past Surgical History  Procedure Laterality Date  . Elbow arthroscopy Left    History reviewed. No pertinent family history. History  Substance Use Topics  . Smoking status: Never Smoker   . Smokeless tobacco: Never Used  . Alcohol Use: No    Review of Systems  Constitutional: Negative for activity change and appetite change.  HENT: Negative for neck stiffness.   Eyes: Negative for pain.  Respiratory: Negative for chest tightness and shortness of breath.   Cardiovascular: Positive for chest pain. Negative for leg swelling.  Gastrointestinal: Positive  for nausea and abdominal pain. Negative for vomiting and diarrhea.  Genitourinary: Negative for flank pain.  Musculoskeletal: Negative for back pain.  Skin: Negative for rash.  Neurological: Negative for weakness, numbness and headaches.  Psychiatric/Behavioral: Negative for behavioral problems.    Allergies  Review of patient's allergies indicates no known allergies.  Home Medications   Current Outpatient Rx  Name  Route  Sig  Dispense  Refill  . EXPIRED: albuterol (PROVENTIL HFA;VENTOLIN HFA) 108 (90 BASE) MCG/ACT inhaler   Inhalation   Inhale 1 puff into the lungs every 6 (six) hours as needed for wheezing.   1 Inhaler   0   . EXPIRED: carvedilol (COREG) 3.125 MG tablet   Oral   Take 1 tablet (3.125 mg total) by mouth 2 (two) times daily with a meal.   60 tablet   0     NEEDS OFFICE VISIT   . lansoprazole (PREVACID) 15 MG capsule   Oral   Take 15 mg by mouth daily.         Marland Kitchen lisinopril (PRINIVIL,ZESTRIL) 10 MG tablet   Oral   Take 10 mg by mouth daily.         . mometasone-formoterol (DULERA) 100-5 MCG/ACT AERO   Inhalation   Inhale 1 puff into the lungs 2 (two) times daily.   100 g   1   . acetaminophen (TYLENOL) 325 MG tablet   Oral   Take 2 tablets (650 mg total) by mouth every 6 (six) hours as needed.         Marland Kitchen  EXPIRED: chlorthalidone (HYGROTON) 25 MG tablet   Oral   Take 1 tablet (25 mg total) by mouth daily.   30 tablet   2   . traMADol (ULTRAM) 50 MG tablet   Oral   Take 1 tablet (50 mg total) by mouth every 8 (eight) hours as needed for pain.   30 tablet   0    BP 116/76  Pulse 74  Temp(Src) 98.5 F (36.9 C) (Oral)  Resp 19  SpO2 97% Physical Exam  Nursing note and vitals reviewed. Constitutional: He is oriented to person, place, and time. He appears well-developed and well-nourished.  Patient appears uncomfortable  HENT:  Head: Normocephalic and atraumatic.  Eyes: EOM are normal. Pupils are equal, round, and reactive to light.   Neck: Normal range of motion. Neck supple.  Cardiovascular: Normal rate, regular rhythm and normal heart sounds.   No murmur heard. Pulmonary/Chest: Effort normal and breath sounds normal.  Abdominal: Soft. Bowel sounds are normal. He exhibits no distension and no mass. There is tenderness. There is no rebound and no guarding.  Diffuse tenderness. No guarding. Easily reducible umbilical hernia without specific tenderness.  Musculoskeletal: Normal range of motion. He exhibits no edema.  Neurological: He is alert and oriented to person, place, and time. No cranial nerve deficit.  Skin: Skin is warm and dry.  Psychiatric: He has a normal mood and affect.    ED Course  Procedures (including critical care time) Labs Review Labs Reviewed  CBC WITH DIFFERENTIAL - Abnormal; Notable for the following:    MCHC 36.5 (*)    Neutrophils Relative % 31 (*)    Lymphocytes Relative 53 (*)    All other components within normal limits  COMPREHENSIVE METABOLIC PANEL - Abnormal; Notable for the following:    Potassium 3.3 (*)    Glucose, Bld 144 (*)    GFR calc non Af Amer 62 (*)    GFR calc Af Amer 72 (*)    All other components within normal limits  CREATININE, SERUM - Abnormal; Notable for the following:    GFR calc non Af Amer 69 (*)    GFR calc Af Amer 80 (*)    All other components within normal limits  LIPASE, BLOOD  URINALYSIS, ROUTINE W REFLEX MICROSCOPIC  TROPONIN I  CBC   Imaging Review No results found.   Date: 03/28/2013  Rate: 79  Rhythm: normal sinus rhythm  QRS Axis: normal  Intervals: normal  ST/T Wave abnormalities: normal  Conduction Disutrbances: none  Narrative Interpretation: unremarkable      MDM   1. Umbilical hernia   2. Small bowel obstruction    Patient with diffuse abdominal pain and vomiting. CT scan done shows umbilical hernia with bowel obstruction. There is an easily reducible umbilical hernia that has been present for the ED stay. It easily  reduces but will return. Seen and admitted for surgery due to the bowel obstruction on CT.    Juliet Rude. Rubin Payor, MD 03/31/13 [id number]

## 2013-03-28 NOTE — ED Notes (Signed)
Aldona Bar, PA with [facility] at bedside.

## 2013-03-28 NOTE — ED Notes (Signed)
Patient transported to X-ray 

## 2013-03-28 NOTE — ED Notes (Signed)
Pt here c/o CP x 1 week and lower abd pain starting today that is severe; pt diaphoretic at present; pt c/o N/V

## 2013-03-29 ENCOUNTER — Other Ambulatory Visit (INDEPENDENT_AMBULATORY_CARE_PROVIDER_SITE_OTHER): Payer: Self-pay | Admitting: Surgery

## 2013-03-29 DIAGNOSIS — K420 Umbilical hernia with obstruction, without gangrene: Secondary | ICD-10-CM | POA: Diagnosis not present

## 2013-03-29 MED ORDER — ACETAMINOPHEN 325 MG PO TABS: 650.0000 mg | ORAL_TABLET | Freq: Four times a day (QID) | ORAL | Status: DC | PRN

## 2013-03-29 MED ORDER — TRAMADOL HCL 50 MG PO TABS: 50.0000 mg | ORAL_TABLET | Freq: Three times a day (TID) | ORAL | Status: DC | PRN

## 2013-03-29 NOTE — Discharge Summary (Signed)
Physician Discharge Summary  Jacob Whitaker WJX:[id number] DOB: November 12, 1955 DOA: 03/28/2013  PCP: Georgianne Fick, MD  Consultation: none  Admit date: 03/28/2013 Discharge date: 03/29/2013  Recommendations for Outpatient Follow-up:   Follow-up Information   Follow up with [facility] A, MD. Call on 04/04/2013. (arrive by; 3pm)    Specialty:  General Surgery   Contact information:   [address] [place] [address] [phone]      Discharge Diagnoses:  1. Reducible umbilical hernia 2. Abdominal pain   Surgical Procedure: none  Discharge Condition: stable Disposition: home  Diet recommendation: soft  Filed Vitals:   03/29/13 0650  BP: 116/76  Pulse: 74  Temp: 98.5 F (36.9 C)  Resp: 19   Hospital Course:   This is a 57 yo black male with a hx of HTN who began having severe abdominal pain yesterday morning at 0500am assocaited with nausea.  Upon [facility] arrival, he had an acute abdominal series which was unremarkable. He then had a CT scan that revealed a SBO secondary to an incarcerated umbilical hernia. The EDP reduced the hernia, however still called [place] for evaluation. He was admitted for observation.  His pain improved.  He was having bowel movements and passing flatus.  He was felt safe for discharge and a close follow up with Dr. Magnus Ivan for outpatient laparoscopy.  We discussed warning signs that warrant immediate attention.  He was advised to come back to ED should his symptoms return.  We discussed dietary modifications.       General appearance: alert and oriented. Calm and cooperative No acute distress. VSS. Afebrile.  Resp: clear to auscultation bilaterally  Cardio: S1S1 RRR without murmurs or gallops. No edema. GI: soft round and nontender. +BS x4 quadrants. No organomegaly, hernias or masses. Easily reducible umbilical hernia  Discharge Instructions     Medication List         acetaminophen 325 MG tablet  Commonly known as:   TYLENOL  Take 2 tablets (650 mg total) by mouth every 6 (six) hours as needed.     albuterol 108 (90 BASE) MCG/ACT inhaler  Commonly known as:  PROVENTIL HFA;VENTOLIN HFA  Inhale 1 puff into the lungs every 6 (six) hours as needed for wheezing.     carvedilol 3.125 MG tablet  Commonly known as:  COREG  Take 1 tablet (3.125 mg total) by mouth 2 (two) times daily with a meal.     chlorthalidone 25 MG tablet  Commonly known as:  HYGROTON  Take 1 tablet (25 mg total) by mouth daily.     lansoprazole 15 MG capsule  Commonly known as:  PREVACID  Take 15 mg by mouth daily.     lisinopril 10 MG tablet  Commonly known as:  PRINIVIL,ZESTRIL  Take 10 mg by mouth daily.     mometasone-formoterol 100-5 MCG/ACT Aero  Commonly known as:  DULERA  Inhale 1 puff into the lungs 2 (two) times daily.     traMADol 50 MG tablet  Commonly known as:  ULTRAM  Take 1 tablet (50 mg total) by mouth every 8 (eight) hours as needed for pain.           Follow-up Information   Call Shelly Rubenstein, MD.   Specialty:  General Surgery   Contact information:   [address] [place] [address] [phone]        The results of significant diagnostics from this hospitalization (including imaging, microbiology, ancillary and laboratory) are  listed below for reference.    Significant Diagnostic Studies: Ct Abdomen Pelvis W Contrast  03/28/2013   *RADIOLOGY REPORT*  Clinical Data: Severe lower abdominal pain with nausea  CT ABDOMEN AND PELVIS WITH CONTRAST  Technique:  Multidetector CT imaging of the abdomen and pelvis was performed following the standard protocol during bolus administration of intravenous contrast.  Contrast: OMNIPAQUE IOHEXOL 300 MG/ML  SOLN  Comparison: 12/27/2010  Findings:  Lung bases are unremarkable.  Mild elevation of the left hemidiaphragm.  No destructive bony lesions are noted.  Mild degenerative changes lumbar spine.  Mild hepatic fatty infiltration.  No  calcified gallstones are noted within gallbladder.  No aortic aneurysm.  The pancreas, spleen and adrenal glands are unremarkable.  No focal hepatic mass.  Bilateral kidneys mildly dilated contour.  Normal bilateral renal enhancement.  No hydronephrosis or hydroureter.  Some stool noted in the right colon.  No pericecal inflammation.  Normal appendix is clearly visualized axial image 54.  Scattered colonic diverticula are noted.  No evidence of acute diverticulitis.  Mild enlarged prostate gland.  Prostate gland measures 5 x 4 cm.  No destructive bony lesions are noted within pelvis.  The urinary bladder is unremarkable.  There is a small umbilical hernia in axial image 63.  The hernia is containing fat and incarcerated small bowel loop.  Contrast material is noted within small bowel up to the level of the hernia. No any contrast material is noted in the small bowel exiting the hernia.  Findings are consistent with small bowel obstruction due to strangulated umbilical hernia.  The hernia measures 2.5 x 3.3 cm.  Best visualized in the sagittal image 102.  Distal small bowel is small caliber collapsed.  The terminal ileum is unremarkable.  Sigmoid colon diverticula. No evidence of acute diverticulitis.  Nonspecific bilateral inguinal lymph nodes are noted.  No destructive bony lesions are noted within pelvis.  Delayed renal images shows bilateral renal symmetrical excretion. Bilateral visualized proximal ureter is unremarkable.  IMPRESSION:  1.  There is a small umbilical hernia containing fat and strangulated small bowel loop as seen in axial image 64.  This is causing small bowel obstruction. 2.  Scattered colonic diverticula.  No evidence of acute diverticulitis. 3.  No pericecal inflammation.  Normal appendix. 4.  Mild hepatic fatty infiltration. 5.  No hydronephrosis or hydroureter.  Critical findings discussed with Dr. Rubin Payor   Original Report Authenticated By: Natasha Mead, M.D.   Dg Abd Acute  W/chest  03/28/2013   *RADIOLOGY REPORT*  Clinical Data: Abdominal pain, nausea, vomiting, diarrhea, history hypertension  ACUTE ABDOMEN SERIES (ABDOMEN 2 VIEW & CHEST 1 VIEW)  Comparison: Chest radiographs 12/10/2011  Findings: Normal heart size, mediastinal contours, and pulmonary vascularity. Peribronchial thickening with mild bibasilar atelectasis greater on left. Lungs otherwise clear. No pleural effusion and pneumothorax. Paucity of bowel gas. Scattered stool throughout proximal half of colon. No bowel dilatation, bowel wall thickening or free intraperitoneal air. Osseous structures unremarkable. No pathologic calcifications.  IMPRESSION: Mild bibasilar atelectasis, greater on left.   Original Report Authenticated By: Ulyses Southward, M.D.   Labs: Basic Metabolic Panel:  Recent Labs Lab 03/28/13 0806 03/28/13 1751  NA 137  --   K 3.3*  --   CL 100  --   CO2 24  --   GLUCOSE 144*  --   BUN 12  --   CREATININE 1.25 1.14  CALCIUM 9.6  --    Liver Function Tests:  Recent Labs Lab 03/28/13  0806  AST 33  ALT 53  ALKPHOS 44  BILITOT 0.3  PROT 7.3  ALBUMIN 4.2    Recent Labs Lab 03/28/13 0806  LIPASE 28   No results found for this basename: AMMONIA,  in the last 168 hours CBC:  Recent Labs Lab 03/28/13 0806 03/28/13 1751  WBC 5.5 8.1  NEUTROABS 1.7  --   HGB 15.8 15.5  HCT 43.3 43.3  MCV 85.4 86.4  PLT 191 214   Cardiac Enzymes:  Recent Labs Lab 03/28/13 0808  TROPONINI <0.30    Principal Problem:   Umbilical hernia with obstruction Active Problems:   HTN (hypertension)   Time coordinating discharge: 30 mins  Signed:  Humaira Sculley, ANP-BC

## 2013-03-29 NOTE — Discharge Summary (Signed)
I have seen and examined the patient and agree with the assessment and plans.  Lillion Elbert A. Saliah Crisp  MD, FACS  

## 2013-03-29 NOTE — Progress Notes (Signed)
Discharge Note. Pt and pt's wife were given discharge instructions and Rx. Pt asked appropriate questions about his diet. Pt smiling and reports that he is ready for discharge. Pt's follow up appointment reviewed.

## 2013-04-04 ENCOUNTER — Ambulatory Visit (INDEPENDENT_AMBULATORY_CARE_PROVIDER_SITE_OTHER): Payer: PRIVATE HEALTH INSURANCE | Admitting: Surgery

## 2013-04-04 ENCOUNTER — Encounter (INDEPENDENT_AMBULATORY_CARE_PROVIDER_SITE_OTHER): Payer: Self-pay | Admitting: Surgery

## 2013-04-04 VITALS — BP 126/76 | HR 72 | Temp 98.4°F | Resp 15 | Ht 66.75 in | Wt 240.2 lb

## 2013-04-04 DIAGNOSIS — K429 Umbilical hernia without obstruction or gangrene: Secondary | ICD-10-CM

## 2013-04-04 NOTE — Progress Notes (Signed)
Subjective:     Patient ID: Jacob Whitaker, male   DOB: September 02, 1955, 57 y.o.   MRN: [id number]  HPI He is here for a followup visit from his incarcerated umbilical hernia last week. He has minimal discomfort and some mild cramping pain but no obstructive symptoms.  Review of Systems     Objective:   Physical Exam The hernia remained reduced    Assessment:     Umbilical hernia previous incarceration     Plan:     We will be proceeding with umbilical hernia repair with mesh next week. The surgery has already been scheduled

## 2013-04-11 ENCOUNTER — Encounter (HOSPITAL_BASED_OUTPATIENT_CLINIC_OR_DEPARTMENT_OTHER): Payer: Self-pay | Admitting: *Deleted

## 2013-04-11 NOTE — Progress Notes (Signed)
Had labs in er 03/28/13-hernia pain with ?SBO Denies sleep apnea- Had cath 90-neg-stress test 2010-neg

## 2013-04-11 NOTE — Progress Notes (Signed)
04/11/13 0937  OBSTRUCTIVE SLEEP APNEA  Have you ever been diagnosed with sleep apnea through a sleep study? No  Do you snore loudly (loud enough to be heard through closed doors)?  1  Do you often feel tired, fatigued, or sleepy during the daytime? 0  Has anyone observed you stop breathing during your sleep? 0  Do you have, or are you being treated for high blood pressure? 1  BMI more than 35 kg/m2? 1  Age over 59 years old? 1  Neck circumference greater than 40 cm/18 inches? 0  Gender: 1  Obstructive Sleep Apnea Score 5  Score 4 or greater  Results sent to PCP

## 2013-04-14 NOTE — H&P (Signed)
Chief Complaint:  abdominal pain, umbilical hernia  HPI: This is a 57 yo black male with a h/o HTN who began having severe abdominal pain recently.  It was in his lower abdomen. He developed nausea and vomiting. Due to pain, he present to [facility]. Upon arrival, he had an acute abdominal series which was unremarkable. He then had a CT scan that revealed a SBO secondary to an incarcerated umbilical hernia. The EDP reduced the hernia, however still called [place] for evaluation. Currently, he is feeling better with less nausea and less pain. His umbilical hernia is again reducible and he is presenting for elective repair with mesh  Review of Systems: Please see HPI, otherwise all other systems have been reviewed and are negative  History reviewed. No pertinent family history.  Past Medical History   Diagnosis  Date   .  Hypertension     Past Surgical History   Procedure  Laterality  Date   .  Elbow arthroscopy  Left    Social History: reports that he has never smoked. He does not have any smokeless tobacco history on file. He reports that he does not drink alcohol or use illicit drugs.  Allergies: No Known Allergies  (Not in a hospital admission)  Blood pressure 114/70, pulse 72, temperature 98 F (36.7 C), temperature source Oral, resp. rate 10, SpO2 97.00%.  Physical Exam:  General: pleasant, WD, WN black male who is laying in bed in NAD  HEENT: head is normocephalic, atraumatic. Sclera are noninjected. PERRL. Ears and nose without any masses or lesions. Mouth is pink and moist  Heart: regular, rate, and rhythm. Normal s1,s2. No obvious murmurs, gallops, or rubs noted. Palpable radial and pedal pulses bilaterally  Lungs: CTAB, no wheezes, rhonchi, or rales noted. Respiratory effort nonlabored  Abd: soft, NT, ND, +BS, no masses or organomegaly. Small reducible umbilical hernia. Defect is palpable  MS: all 4 extremities are symmetrical with no cyanosis, clubbing, or edema.  Skin: warm and dry with no  masses, lesions, or rashes  Psych: A&Ox3 with an appropriate affect.  Results for orders placed during the hospital encounter of 03/28/13 (from the past 48 hour(s))   CBC WITH DIFFERENTIAL Status: Abnormal    Collection Time    03/28/13 8:06 AM   Result  Value  Range    WBC  5.5  4.0 - 10.5 K/uL    RBC  5.07  4.22 - 5.81 MIL/uL    Hemoglobin  15.8  13.0 - 17.0 g/dL    HCT  16.1  09.6 - 04.5 %    MCV  85.4  78.0 - 100.0 fL    MCH  31.2  26.0 - 34.0 pg    MCHC  36.5 (*)  30.0 - 36.0 g/dL    RDW  40.9  81.1 - 91.4 %    Platelets  191  150 - 400 K/uL    Neutrophils Relative %  31 (*)  43 - 77 %    Neutro Abs  1.7  1.7 - 7.7 K/uL    Lymphocytes Relative  53 (*)  12 - 46 %    Lymphs Abs  3.0  0.7 - 4.0 K/uL    Monocytes Relative  10  3 - 12 %    Monocytes Absolute  0.5  0.1 - 1.0 K/uL    Eosinophils Relative  5  0 - 5 %    Eosinophils Absolute  0.3  0.0 - 0.7 K/uL    Basophils Relative  1  0 - 1 %    Basophils Absolute  0.1  0.0 - 0.1 K/uL   COMPREHENSIVE METABOLIC PANEL Status: Abnormal    Collection Time    03/28/13 8:06 AM   Result  Value  Range    Sodium  137  135 - 145 mEq/L    Potassium  3.3 (*)  3.5 - 5.1 mEq/L    Chloride  100  96 - 112 mEq/L    CO2  24  19 - 32 mEq/L    Glucose, Bld  144 (*)  70 - 99 mg/dL    BUN  12  6 - 23 mg/dL    Creatinine, Ser  1.61  0.50 - 1.35 mg/dL    Calcium  9.6  8.4 - 10.5 mg/dL    Total Protein  7.3  6.0 - 8.3 g/dL    Albumin  4.2  3.5 - 5.2 g/dL    AST  33  0 - 37 U/L    ALT  53  0 - 53 U/L    Alkaline Phosphatase  44  39 - 117 U/L    Total Bilirubin  0.3  0.3 - 1.2 mg/dL    GFR calc non Af Amer  62 (*)  >90 mL/min    GFR calc Af Amer  72 (*)  >90 mL/min    Comment:  (NOTE)     The eGFR has been calculated using the CKD EPI equation.     This calculation has not been validated in all clinical situations.     eGFR's persistently <90 mL/min signify possible Chronic Kidney     Disease.   LIPASE, BLOOD Status: None    Collection Time     03/28/13 8:06 AM   Result  Value  Range    Lipase  28  11 - 59 U/L   TROPONIN I Status: None    Collection Time    03/28/13 8:08 AM   Result  Value  Range    Troponin I  <0.30  <0.30 ng/mL    Comment:      Due to the release kinetics of cTnI,     a negative result within the first hours     of the onset of symptoms does not rule out     myocardial infarction with certainty.     If myocardial infarction is still suspected,     repeat the test at appropriate intervals.   URINALYSIS, ROUTINE W REFLEX MICROSCOPIC Status: None    Collection Time    03/28/13 9:06 AM   Result  Value  Range    Color, Urine  YELLOW  YELLOW    APPearance  CLEAR  CLEAR    Specific Gravity, Urine  1.022  1.005 - 1.030    pH  7.0  5.0 - 8.0    Glucose, UA  NEGATIVE  NEGATIVE mg/dL    Hgb urine dipstick  NEGATIVE  NEGATIVE    Bilirubin Urine  NEGATIVE  NEGATIVE    Ketones, ur  NEGATIVE  NEGATIVE mg/dL    Protein, ur  NEGATIVE  NEGATIVE mg/dL    Urobilinogen, UA  1.0  0.0 - 1.0 mg/dL    Nitrite  NEGATIVE  NEGATIVE    Leukocytes, UA  NEGATIVE  NEGATIVE    Comment:  MICROSCOPIC NOT DONE ON URINES WITH NEGATIVE PROTEIN, BLOOD, LEUKOCYTES, NITRITE, OR GLUCOSE <1000 mg/dL.   Ct Abdomen Pelvis W Contrast  03/28/2013 *RADIOLOGY REPORT* Clinical Data: Severe lower abdominal  pain with nausea CT ABDOMEN AND PELVIS WITH CONTRAST Technique: Multidetector CT imaging of the abdomen and pelvis was performed following the standard protocol during bolus administration of intravenous contrast. Contrast: OMNIPAQUE IOHEXOL 300 MG/ML SOLN Comparison: 12/27/2010 Findings: Lung bases are unremarkable. Mild elevation of the left hemidiaphragm. No destructive bony lesions are noted. Mild degenerative changes lumbar spine. Mild hepatic fatty infiltration. No calcified gallstones are noted within gallbladder. No aortic aneurysm. The pancreas, spleen and adrenal glands are unremarkable. No focal hepatic mass. Bilateral kidneys mildly  dilated contour. Normal bilateral renal enhancement. No hydronephrosis or hydroureter. Some stool noted in the right colon. No pericecal inflammation. Normal appendix is clearly visualized axial image 54. Scattered colonic diverticula are noted. No evidence of acute diverticulitis. Mild enlarged prostate gland. Prostate gland measures 5 x 4 cm. No destructive bony lesions are noted within pelvis. The urinary bladder is unremarkable. There is a small umbilical hernia in axial image 63. The hernia is containing fat and incarcerated small bowel loop. Contrast material is noted within small bowel up to the level of the hernia. No any contrast material is noted in the small bowel exiting the hernia. Findings are consistent with small bowel obstruction due to strangulated umbilical hernia. The hernia measures 2.5 x 3.3 cm. Best visualized in the sagittal image 102. Distal small bowel is small caliber collapsed. The terminal ileum is unremarkable. Sigmoid colon diverticula. No evidence of acute diverticulitis. Nonspecific bilateral inguinal lymph nodes are noted. No destructive bony lesions are noted within pelvis. Delayed renal images shows bilateral renal symmetrical excretion. Bilateral visualized proximal ureter is unremarkable. IMPRESSION: 1. There is a small umbilical hernia containing fat and strangulated small bowel loop as seen in axial image 64. This is causing small bowel obstruction. 2. Scattered colonic diverticula. No evidence of acute diverticulitis. 3. No pericecal inflammation. Normal appendix. 4. Mild hepatic fatty infiltration. 5. No hydronephrosis or hydroureter. Critical findings discussed with Dr. Rubin Payor Original Report Authenticated By: Natasha Mead, M.D.  Dg Abd Acute W/chest  03/28/2013 *RADIOLOGY REPORT* Clinical Data: Abdominal pain, nausea, vomiting, diarrhea, history hypertension ACUTE ABDOMEN SERIES (ABDOMEN 2 VIEW & CHEST 1 VIEW) Comparison: Chest radiographs 12/10/2011 Findings: Normal heart  size, mediastinal contours, and pulmonary vascularity. Peribronchial thickening with mild bibasilar atelectasis greater on left. Lungs otherwise clear. No pleural effusion and pneumothorax. Paucity of bowel gas. Scattered stool throughout proximal half of colon. No bowel dilatation, bowel wall thickening or free intraperitoneal air. Osseous structures unremarkable. No pathologic calcifications. IMPRESSION: Mild bibasilar atelectasis, greater on left. Original Report Authenticated By: Ulyses Southward, M.D.  Assessment/Plan  Umbilical hernia with recent incarceration   Repair with mesh is recommended.  The risks including but not limited to bleeding, infection, injury to surrounding structures, recurrence, etc were discussed.  He agrees to proceed.

## 2013-04-15 ENCOUNTER — Encounter (HOSPITAL_BASED_OUTPATIENT_CLINIC_OR_DEPARTMENT_OTHER): Payer: Self-pay | Admitting: Certified Registered Nurse Anesthetist

## 2013-04-15 ENCOUNTER — Encounter (HOSPITAL_BASED_OUTPATIENT_CLINIC_OR_DEPARTMENT_OTHER)
Admission: RE | Disposition: A | Discharge status: Home / Self Care | Payer: Self-pay | Source: Ambulatory Visit | Attending: Surgery

## 2013-04-15 ENCOUNTER — Encounter (HOSPITAL_BASED_OUTPATIENT_CLINIC_OR_DEPARTMENT_OTHER): Payer: Self-pay | Admitting: *Deleted

## 2013-04-15 ENCOUNTER — Ambulatory Visit (HOSPITAL_BASED_OUTPATIENT_CLINIC_OR_DEPARTMENT_OTHER): Payer: 59 | Admitting: Certified Registered Nurse Anesthetist

## 2013-04-15 ENCOUNTER — Ambulatory Visit (HOSPITAL_BASED_OUTPATIENT_CLINIC_OR_DEPARTMENT_OTHER)
Admission: RE | Admit: 2013-04-15 | Discharge: 2013-04-15 | Disposition: A | Discharge status: Home / Self Care | Payer: 59 | Source: Ambulatory Visit | Attending: Surgery | Admitting: Surgery

## 2013-04-15 DIAGNOSIS — K420 Umbilical hernia with obstruction, without gangrene: Secondary | ICD-10-CM | POA: Insufficient documentation

## 2013-04-15 DIAGNOSIS — K429 Umbilical hernia without obstruction or gangrene: Secondary | ICD-10-CM

## 2013-04-15 DIAGNOSIS — I10 Essential (primary) hypertension: Secondary | ICD-10-CM | POA: Insufficient documentation

## 2013-04-15 HISTORY — DX: Presence of spectacles and contact lenses: Z97.3

## 2013-04-15 HISTORY — DX: Presence of dental prosthetic device (complete) (partial): Z97.2

## 2013-04-15 HISTORY — DX: Complete loss of teeth, unspecified cause, unspecified class: K08.109

## 2013-04-15 HISTORY — PX: UMBILICAL HERNIA REPAIR: SHX196

## 2013-04-15 HISTORY — PX: INSERTION OF MESH: SHX5868

## 2013-04-15 SURGERY — REPAIR, HERNIA, UMBILICAL, ADULT
Anesthesia: General | Site: Abdomen | Wound class: Clean

## 2013-04-15 MED ORDER — OXYCODONE-ACETAMINOPHEN 5-325 MG PO TABS: 1.0000 | ORAL_TABLET | ORAL | Status: DC | PRN

## 2013-04-15 MED ORDER — ONDANSETRON HCL 4 MG/2ML IJ SOLN: 4.0000 mg | Freq: Four times a day (QID) | INTRAMUSCULAR | Status: DC | PRN

## 2013-04-15 MED ORDER — CEFAZOLIN SODIUM-DEXTROSE 2-3 GM-% IV SOLR
2.0000 g | INTRAVENOUS | Status: AC
  Administered 2013-04-15: 2 g via INTRAVENOUS

## 2013-04-15 MED ORDER — MEPERIDINE HCL 25 MG/ML IJ SOLN: 6.2500 mg | INTRAMUSCULAR | Status: DC | PRN

## 2013-04-15 MED ORDER — MIDAZOLAM HCL 2 MG/2ML IJ SOLN
0.5000 mg | Freq: Once | INTRAMUSCULAR | Status: DC | PRN
Start: 2013-04-15 — End: 2013-04-15

## 2013-04-15 MED ORDER — OXYCODONE HCL 5 MG/5ML PO SOLN: 5.0000 mg | Freq: Once | ORAL | Status: AC | PRN

## 2013-04-15 MED ORDER — OXYCODONE HCL 5 MG PO TABS
5.0000 mg | ORAL_TABLET | Freq: Once | ORAL | Status: AC | PRN
  Administered 2013-04-15: 5 mg via ORAL

## 2013-04-15 MED ORDER — SODIUM CHLORIDE 0.9 % IV SOLN: 250.0000 mL | INTRAVENOUS | Status: DC | PRN

## 2013-04-15 MED ORDER — FENTANYL CITRATE 0.05 MG/ML IJ SOLN
INTRAMUSCULAR | Status: DC | PRN
  Administered 2013-04-15: 25 ug via INTRAVENOUS
  Administered 2013-04-15: 50 ug via INTRAVENOUS
  Administered 2013-04-15 (×2): 25 ug via INTRAVENOUS

## 2013-04-15 MED ORDER — LACTATED RINGERS IV SOLN
INTRAVENOUS | Status: DC
  Administered 2013-04-15 (×2): via INTRAVENOUS

## 2013-04-15 MED ORDER — PROMETHAZINE HCL 25 MG/ML IJ SOLN: 6.2500 mg | INTRAMUSCULAR | Status: DC | PRN

## 2013-04-15 MED ORDER — HYDROMORPHONE HCL PF 1 MG/ML IJ SOLN: 0.2500 mg | INTRAMUSCULAR | Status: DC | PRN

## 2013-04-15 MED ORDER — SODIUM CHLORIDE 0.9 % IJ SOLN: 3.0000 mL | INTRAMUSCULAR | Status: DC | PRN

## 2013-04-15 MED ORDER — ACETAMINOPHEN 650 MG RE SUPP: 650.0000 mg | RECTAL | Status: DC | PRN

## 2013-04-15 MED ORDER — BUPIVACAINE-EPINEPHRINE 0.5% -1:200000 IJ SOLN
INTRAMUSCULAR | Status: DC | PRN
  Administered 2013-04-15: 20 mL

## 2013-04-15 MED ORDER — ONDANSETRON HCL 4 MG/2ML IJ SOLN
INTRAMUSCULAR | Status: DC | PRN
  Administered 2013-04-15: 4 mg via INTRAVENOUS

## 2013-04-15 MED ORDER — SODIUM CHLORIDE 0.9 % IJ SOLN: 3.0000 mL | Freq: Two times a day (BID) | INTRAMUSCULAR | Status: DC

## 2013-04-15 MED ORDER — LIDOCAINE HCL (CARDIAC) 20 MG/ML IV SOLN
INTRAVENOUS | Status: DC | PRN
  Administered 2013-04-15: 60 mg via INTRAVENOUS

## 2013-04-15 MED ORDER — ACETAMINOPHEN 325 MG PO TABS: 650.0000 mg | ORAL_TABLET | ORAL | Status: DC | PRN

## 2013-04-15 MED ORDER — MIDAZOLAM HCL 5 MG/5ML IJ SOLN
INTRAMUSCULAR | Status: DC | PRN
  Administered 2013-04-15: 2 mg via INTRAVENOUS

## 2013-04-15 MED ORDER — FENTANYL CITRATE 0.05 MG/ML IJ SOLN: 25.0000 ug | INTRAMUSCULAR | Status: DC | PRN

## 2013-04-15 MED ORDER — OXYCODONE HCL 5 MG PO TABS: 5.0000 mg | ORAL_TABLET | ORAL | Status: DC | PRN

## 2013-04-15 MED ORDER — DEXAMETHASONE SODIUM PHOSPHATE 4 MG/ML IJ SOLN
INTRAMUSCULAR | Status: DC | PRN
  Administered 2013-04-15: 10 mg via INTRAVENOUS

## 2013-04-15 MED ORDER — PROPOFOL 10 MG/ML IV BOLUS
INTRAVENOUS | Status: DC | PRN
  Administered 2013-04-15: 240 mg via INTRAVENOUS

## 2013-04-15 SURGICAL SUPPLY — 44 items
BENZOIN TINCTURE PRP APPL 2/3 (GAUZE/BANDAGES/DRESSINGS) ×2 IMPLANT
BLADE HEX COATED 2.75 (ELECTRODE) ×2 IMPLANT
BLADE SURG 15 STRL LF DISP TIS (BLADE) ×1 IMPLANT
BLADE SURG 15 STRL SS (BLADE) ×1
BLADE SURG ROTATE 9660 (MISCELLANEOUS) ×2 IMPLANT
CANISTER SUCTION 1200CC (MISCELLANEOUS) IMPLANT
CHLORAPREP W/TINT 26ML (MISCELLANEOUS) ×2 IMPLANT
CLEANER CAUTERY TIP 5X5 PAD (MISCELLANEOUS) ×1 IMPLANT
CLOTH BEACON ORANGE TIMEOUT ST (SAFETY) ×2 IMPLANT
COVER MAYO STAND STRL (DRAPES) ×2 IMPLANT
COVER TABLE BACK 60X90 (DRAPES) ×2 IMPLANT
DECANTER SPIKE VIAL GLASS SM (MISCELLANEOUS) IMPLANT
DRAPE PED LAPAROTOMY (DRAPES) ×2 IMPLANT
DRAPE UTILITY XL STRL (DRAPES) ×2 IMPLANT
DRSG TEGADERM 2-3/8X2-3/4 SM (GAUZE/BANDAGES/DRESSINGS) ×2 IMPLANT
DRSG TEGADERM 4X4.75 (GAUZE/BANDAGES/DRESSINGS) ×2 IMPLANT
ELECT REM PT RETURN 9FT ADLT (ELECTROSURGICAL) ×2
ELECTRODE REM PT RTRN 9FT ADLT (ELECTROSURGICAL) ×1 IMPLANT
GLOVE BIOGEL PI IND STRL 7.0 (GLOVE) ×1 IMPLANT
GLOVE BIOGEL PI INDICATOR 7.0 (GLOVE) ×1
GLOVE ECLIPSE 6.5 STRL STRAW (GLOVE) ×2 IMPLANT
GLOVE SURG SIGNA 7.5 PF LTX (GLOVE) ×2 IMPLANT
GOWN PREVENTION PLUS XLARGE (GOWN DISPOSABLE) ×2 IMPLANT
GOWN PREVENTION PLUS XXLARGE (GOWN DISPOSABLE) ×2 IMPLANT
MESH VENTRALEX ST 2.5 CRC MED (Mesh General) ×2 IMPLANT
NEEDLE HYPO 25X1 1.5 SAFETY (NEEDLE) ×2 IMPLANT
NS IRRIG 1000ML POUR BTL (IV SOLUTION) ×2 IMPLANT
PACK BASIN DAY SURGERY FS (CUSTOM PROCEDURE TRAY) ×2 IMPLANT
PAD CLEANER CAUTERY TIP 5X5 (MISCELLANEOUS) ×1
PENCIL BUTTON HOLSTER BLD 10FT (ELECTRODE) ×2 IMPLANT
SLEEVE SCD COMPRESS KNEE MED (MISCELLANEOUS) ×2 IMPLANT
SPONGE LAP 4X18 X RAY DECT (DISPOSABLE) ×2 IMPLANT
STRIP CLOSURE SKIN 1/2X4 (GAUZE/BANDAGES/DRESSINGS) ×2 IMPLANT
SUT MNCRL AB 4-0 PS2 18 (SUTURE) ×2 IMPLANT
SUT NOVA NAB DX-16 0-1 5-0 T12 (SUTURE) ×4 IMPLANT
SUT VIC AB 2-0 SH 27 (SUTURE)
SUT VIC AB 2-0 SH 27XBRD (SUTURE) IMPLANT
SUT VIC AB 3-0 SH 27 (SUTURE) ×2
SUT VIC AB 3-0 SH 27X BRD (SUTURE) ×2 IMPLANT
SYR CONTROL 10ML LL (SYRINGE) ×2 IMPLANT
TOWEL OR 17X24 6PK STRL BLUE (TOWEL DISPOSABLE) ×2 IMPLANT
TOWEL OR NON WOVEN STRL DISP B (DISPOSABLE) IMPLANT
TUBE CONNECTING 20X1/4 (TUBING) IMPLANT
YANKAUER SUCT BULB TIP NO VENT (SUCTIONS) IMPLANT

## 2013-04-15 NOTE — Op Note (Signed)
[name], [name]                ACCOUNT NO.:  [id number]  MEDICAL RECORD NO.:  [id number]  LOCATION:                                FACILITY:  [facility]  PHYSICIAN:  Abigail Miyamoto, M.D. DATE OF BIRTH:  06-21-1956  DATE OF PROCEDURE:  04/15/2013 DATE OF DISCHARGE:  04/15/2013                              OPERATIVE REPORT   POSTOPERATIVE DIAGNOSIS:  Umbilical hernia.  POSTOPERATIVE DIAGNOSIS:  Umbilical hernia.  PROCEDURES:  Umbilical hernia repair with mesh.  SURGEON:  Abigail Miyamoto, M.D.  ANESTHESIA:  General and 0.5% Marcaine.  ESTIMATED BLOOD LOSS:  Minimal.  INDICATIONS:  This is a 57 year old gentleman, who is present with a previously incarcerated umbilical hernia, which was able to be reduced. He now presents for elective repair with mesh.  PROCEDURE IN DETAIL:  The patient was brought to the operating room, identified as Jacob Whitaker.  He was placed on the operating table and general anesthesia was induced.  His abdomen was then prepped and draped in usual sterile fashion.  I anesthetized the skin with Marcaine.  I made a small transverse incision at the lower edge of the umbilicus.  I took this down to the hernia sac, which I separated from the overlying umbilical skin.  I then excised the sac in its entirety.  All contents had been reduced into the abdominal cavity.  The fascial defect itself was less than 2 cm in size.  I brought a 6.4 cm round umbilical patch onto the field.  I placed it through the opening the fascial defect and then pulled it up with the ties tight against the peritoneal surface.  I then sewed it in place circumferentially with interrupted #1 Novafil sutures.  I then cut the stay ties and closed the fascia over the top of the mesh with a figure-of-eight #1 Novafil sutures well.  I then anesthetized the fascia further with Marcaine.  I tacked the umbilical skin back in place with a 3-0 Vicryl suture.  I then closed the subcutaneous tissue  with interrupted 3-0 Vicryl sutures and closed the skin with a running 4-0 Monocryl.  Steri-Strips, gauze, and Tegaderm were then applied.  The patient tolerated the procedure well.  All counts were correct at the end of the procedure.  The patient was then extubated in the operating room and taken in a stable condition to the recovery room.     Abigail Miyamoto, M.D.   ______________________________ Abigail Miyamoto, M.D.    DB/MEDQ  D:  04/15/2013  T:  04/15/2013  Job:  [id number]

## 2013-04-15 NOTE — Anesthesia Procedure Notes (Signed)
Procedure Name: LMA Insertion Date/Time: 04/15/2013 7:22 AM Performed by: Sunfield Cohick D Pre-anesthesia Checklist: Patient identified, Emergency Drugs available, Suction available and Patient being monitored Patient Re-evaluated:Patient Re-evaluated prior to inductionOxygen Delivery Method: Circle System Utilized Preoxygenation: Pre-oxygenation with 100% oxygen Intubation Type: IV induction Ventilation: Mask ventilation without difficulty LMA: LMA inserted LMA Size: 5.0 Number of attempts: 1 Airway Equipment and Method: bite block Placement Confirmation: positive ETCO2 Tube secured with: Tape Dental Injury: Teeth and Oropharynx as per pre-operative assessment

## 2013-04-15 NOTE — Anesthesia Preprocedure Evaluation (Addendum)
Anesthesia Evaluation  Patient identified by MRN, date of birth, ID band Patient awake    Reviewed: Allergy & Precautions, H&P , NPO status , Patient's Chart, lab work & pertinent test results  History of Anesthesia Complications Negative for: history of anesthetic complications  Airway Mallampati: I TM Distance: >3 FB Neck ROM: Full    Dental  (+) Poor Dentition, Missing and Dental Advisory Given   Pulmonary asthma , COPD COPD inhaler,  breath sounds clear to auscultation  Pulmonary exam normal       Cardiovascular hypertension, Pt. on medications Rhythm:Regular Rate:Normal  '10 stress test: no ischemia or scar, EF 70%   Neuro/Psych negative neurological ROS     GI/Hepatic Neg liver ROS, GERD-  Medicated and Controlled,  Endo/Other  Morbid obesity  Renal/GU negative Renal ROS     Musculoskeletal   Abdominal (+) + obese,   Peds  Hematology negative hematology ROS (+)   Anesthesia Other Findings   Reproductive/Obstetrics                          Anesthesia Physical Anesthesia Plan  ASA: II  Anesthesia Plan: General   Post-op Pain Management:    Induction: Intravenous  Airway Management Planned: LMA  Additional Equipment:   Intra-op Plan:   Post-operative Plan:   Informed Consent: I have reviewed the patients History and Physical, chart, labs and discussed the procedure including the risks, benefits and alternatives for the proposed anesthesia with the patient or authorized representative who has indicated his/her understanding and acceptance.   Dental advisory given  Plan Discussed with: CRNA and Surgeon  Anesthesia Plan Comments: (Plan routine monitors, GA- LMA OK)        Anesthesia Quick Evaluation

## 2013-04-15 NOTE — Transfer of Care (Signed)
Immediate Anesthesia Transfer of Care Note  Patient: Jacob Whitaker  Procedure(s) Performed: Procedure(s): HERNIA REPAIR UMBILICAL ADULT (N/A) INSERTION OF MESH (N/A)  Patient Location: PACU  Anesthesia Type:General  Level of Consciousness: awake and patient cooperative  Airway & Oxygen Therapy: Patient Spontanous Breathing and Patient connected to face mask oxygen  Post-op Assessment: Report given to PACU RN and Post -op Vital signs reviewed and stable  Post vital signs: Reviewed and stable  Complications: No apparent anesthesia complications

## 2013-04-15 NOTE — Op Note (Signed)
HERNIA REPAIR UMBILICAL ADULT, INSERTION OF MESH  Procedure Note  Jacob Whitaker 04/15/2013   Pre-op Diagnosis: umbilical hernia     Post-op Diagnosis: same  Procedure(s): HERNIA REPAIR UMBILICAL ADULT INSERTION OF MESH (6.4cm v-patch)  Surgeon(s): Shelly Rubenstein, MD  Anesthesia: General  Staff:  Circulator: Aleen Sells, RN Scrub Person: Randalyn Rhea, RN  Estimated Blood Loss: Minimal                         Jacob Whitaker   Date: 04/15/2013  Time: 7:56 AM

## 2013-04-15 NOTE — Anesthesia Postprocedure Evaluation (Signed)
  Anesthesia Post-op Note  Patient: Jacob Whitaker  Procedure(s) Performed: Procedure(s): HERNIA REPAIR UMBILICAL ADULT (N/A) INSERTION OF MESH (N/A)  Patient Location: PACU  Anesthesia Type:General  Level of Consciousness: awake, alert , oriented and patient cooperative  Airway and Oxygen Therapy: Patient Spontanous Breathing  Post-op Pain: none  Post-op Assessment: Post-op Vital signs reviewed, Patient's Cardiovascular Status Stable, Respiratory Function Stable, Patent Airway, No signs of Nausea or vomiting and Pain level controlled  Post-op Vital Signs: Reviewed and stable  Complications: No apparent anesthesia complications

## 2013-04-18 ENCOUNTER — Encounter (HOSPITAL_BASED_OUTPATIENT_CLINIC_OR_DEPARTMENT_OTHER): Payer: Self-pay | Admitting: Surgery

## 2013-04-22 ENCOUNTER — Encounter (INDEPENDENT_AMBULATORY_CARE_PROVIDER_SITE_OTHER): Payer: Self-pay | Admitting: Surgery

## 2013-04-22 ENCOUNTER — Ambulatory Visit (INDEPENDENT_AMBULATORY_CARE_PROVIDER_SITE_OTHER): Payer: PRIVATE HEALTH INSURANCE | Admitting: Surgery

## 2013-04-22 VITALS — BP 142/84 | HR 82 | Resp 18 | Ht 67.0 in | Wt 235.0 lb

## 2013-04-22 DIAGNOSIS — Z09 Encounter for follow-up examination after completed treatment for conditions other than malignant neoplasm: Secondary | ICD-10-CM

## 2013-04-22 NOTE — Progress Notes (Signed)
Subjective:     Patient ID: Jacob Whitaker, male   DOB: [birth year], 57 y.o.   MRN: [id number]  HPI He is here for his first postop visit status post umbilical hernia. Mesh. He is only one week out and still has some moderate discomfort at the incision.  Review of Systems     Objective:   Physical Exam On exam, his incision is well-healed without evidence of recurrent hernia or infection    Assessment:     Patient stable postop     Plan:     He will refrain from heavy lifting for 3 more weeks. I am writing for him to return to work on October 13. Should this change, he will notify me. If not, I will see him back as needed

## 2013-05-26 ENCOUNTER — Other Ambulatory Visit: Payer: Self-pay

## 2013-09-17 ENCOUNTER — Ambulatory Visit (INDEPENDENT_AMBULATORY_CARE_PROVIDER_SITE_OTHER): Payer: 59 | Admitting: Family Medicine

## 2013-09-17 ENCOUNTER — Other Ambulatory Visit: Payer: Self-pay | Admitting: Family Medicine

## 2013-09-17 ENCOUNTER — Ambulatory Visit: Payer: 59

## 2013-09-17 VITALS — BP 126/84 | HR 83 | Temp 98.5°F | Resp 16 | Ht 67.0 in | Wt 241.0 lb

## 2013-09-17 DIAGNOSIS — M25562 Pain in left knee: Principal | ICD-10-CM

## 2013-09-17 DIAGNOSIS — M25561 Pain in right knee: Secondary | ICD-10-CM

## 2013-09-17 DIAGNOSIS — M2550 Pain in unspecified joint: Secondary | ICD-10-CM

## 2013-09-17 MED ORDER — MELOXICAM 15 MG PO TABS: 15.0000 mg | ORAL_TABLET | Freq: Every day | ORAL | Status: DC

## 2013-09-17 NOTE — Progress Notes (Signed)
Chief Complaint:  Chief Complaint  Patient presents with  . Knee Pain    both, 1 month, no injury  . Back Pain    lower left    HPI: Jacob Whitaker is a 58 y.o. male who is here for 1 month history of bilateral knee pain,  They grind and they are in constant pain, even when he is laying down, he has not been sleeping much due to pain, He gets up at 1-2 am in the morning. He does not feel unstable all the time, he feels it gives way when he is walking up stairs, and down and on flat surfaces. He has not had any knee injuires. No h.o Ra in family. Denies any numbness or tinlging. He has tried mostly ibuprofen, tylenol, aleve with no reilef. He  He has back pain abouthte same time. He was going to the chiropractor. The right started first then the left. Since he has had knee pain he has started having back pain .  He was doing better until his knees started acting up. He is a Copy , he walks a lot on cement. He uses shoes with arch. He also squats a lot which makes it worse.  Has tried otc tylenol for this. He is allergic to narcotic meds.     Past Medical History  Diagnosis Date  . Hypertension   . Hernia, umbilical 03/2013  . Wears glasses   . Full dentures    Past Surgical History  Procedure Laterality Date  . Elbow arthroscopy Left   . Cardiac catheterization  1990    cone-cp-  . Umbilical hernia repair N/A 04/15/2013    Procedure: HERNIA REPAIR UMBILICAL ADULT;  Surgeon: Shelly Rubenstein, MD;  Location: [facility];  Service: General;  Laterality: N/A;  . Insertion of mesh N/A 04/15/2013    Procedure: INSERTION OF MESH;  Surgeon: Shelly Rubenstein, MD;  Location: [facility];  Service: General;  Laterality: N/A;   History   Social History  . Marital Status: Married    Spouse Name: N/A    Number of Children: N/A  . Years of Education: N/A   Social History Main Topics  . Smoking status: Never Smoker   . Smokeless tobacco: Never  Used  . Alcohol Use: No  . Drug Use: No  . Sexual Activity: None   Other Topics Concern  . None   Social History Narrative  . None   Family History  Problem Relation Age of Onset  . Cancer Father     stomach   Allergies  Allergen Reactions  . Morphine And Related Nausea Only  . Oxycodone Nausea Only   Prior to Admission medications   Medication Sig Start Date End Date Taking? Authorizing Provider  carvedilol (COREG) 3.125 MG tablet Take 1 tablet (3.125 mg total) by mouth 2 (two) times daily with a meal. 01/25/12 09/17/13 Yes Marzella Schlein McClung, PA-C  hyoscyamine (ANASPAZ) 0.125 MG TBDP tablet Place under the tongue.   Yes Historical Provider, MD  lansoprazole (PREVACID) 15 MG capsule Take 15 mg by mouth daily.   Yes Historical Provider, MD  losartan (COZAAR) 50 MG tablet  02/10/13  Yes Historical Provider, MD  chlorthalidone (HYGROTON) 25 MG tablet Take 1 tablet (25 mg total) by mouth daily. 12/10/11 04/15/13  Dois Davenport, MD     ROS: The patient denies fevers, chills, night sweats, unintentional weight loss, chest pain, palpitations, wheezing, dyspnea on  exertion, nausea, vomiting, abdominal pain, dysuria, hematuria, melena, numbness, weakness, or tingling.  All other systems have been reviewed and were otherwise negative with the exception of those mentioned in the HPI and as above.    PHYSICAL EXAM: Filed Vitals:   09/17/13 1614  BP: 126/84  Pulse: 83  Temp: 98.5 F (36.9 C)  Resp: 16   Filed Vitals:   09/17/13 1614  Height: 5\' 7"  (1.702 m)  Weight: 241 lb (109.317 kg)   Body mass index is 37.74 kg/(m^2).  General: Alert, no acute distress HEENT:  Normocephalic, atraumatic, oropharynx patent. EOMI, PERRLA Cardiovascular:  Regular rate and rhythm, no rubs murmurs or gallops.  No Carotid bruits, radial pulse intact. No pedal edema.  Respiratory: Clear to auscultation bilaterally.  No wheezes, rales, or rhonchi.  No cyanosis, no use of accessory musculature GI: No  organomegaly, abdomen is soft and non-tender, positive bowel sounds.  No masses. Skin: No rashes. Neurologic: Facial musculature symmetric. Psychiatric: Patient is appropriate throughout our interaction. Lymphatic: No cervical lymphadenopathy Musculoskeletal: Gait intact. Bialteral knee-no deformities 2/2 sterngth, no swelling, no erythema, 2/2 DTRs, neg McMurray, Lachman, jt line tenderness + diffuse anterior knee tenderness   LABS: Results for orders placed during the hospital encounter of 03/28/13  CBC WITH DIFFERENTIAL      Result Value Ref Range   WBC 5.5  4.0 - 10.5 K/uL   RBC 5.07  4.22 - 5.81 MIL/uL   Hemoglobin 15.8  13.0 - 17.0 g/dL   HCT 16.143.3  09.639.0 - 04.552.0 %   MCV 85.4  78.0 - 100.0 fL   MCH 31.2  26.0 - 34.0 pg   MCHC 36.5 (*) 30.0 - 36.0 g/dL   RDW 40.913.3  81.111.5 - 91.415.5 %   Platelets 191  150 - 400 K/uL   Neutrophils Relative % 31 (*) 43 - 77 %   Neutro Abs 1.7  1.7 - 7.7 K/uL   Lymphocytes Relative 53 (*) 12 - 46 %   Lymphs Abs 3.0  0.7 - 4.0 K/uL   Monocytes Relative 10  3 - 12 %   Monocytes Absolute 0.5  0.1 - 1.0 K/uL   Eosinophils Relative 5  0 - 5 %   Eosinophils Absolute 0.3  0.0 - 0.7 K/uL   Basophils Relative 1  0 - 1 %   Basophils Absolute 0.1  0.0 - 0.1 K/uL  COMPREHENSIVE METABOLIC PANEL      Result Value Ref Range   Sodium 137  135 - 145 mEq/L   Potassium 3.3 (*) 3.5 - 5.1 mEq/L   Chloride 100  96 - 112 mEq/L   CO2 24  19 - 32 mEq/L   Glucose, Bld 144 (*) 70 - 99 mg/dL   BUN 12  6 - 23 mg/dL   Creatinine, Ser 7.821.25  0.50 - 1.35 mg/dL   Calcium 9.6  8.4 - 95.610.5 mg/dL   Total Protein 7.3  6.0 - 8.3 g/dL   Albumin 4.2  3.5 - 5.2 g/dL   AST 33  0 - 37 U/L   ALT 53  0 - 53 U/L   Alkaline Phosphatase 44  39 - 117 U/L   Total Bilirubin 0.3  0.3 - 1.2 mg/dL   GFR calc non Af Amer 62 (*) >90 mL/min   GFR calc Af Amer 72 (*) >90 mL/min  LIPASE, BLOOD      Result Value Ref Range   Lipase 28  11 - 59 U/L  URINALYSIS, ROUTINE W  REFLEX MICROSCOPIC       Result Value Ref Range   Color, Urine YELLOW  YELLOW   APPearance CLEAR  CLEAR   Specific Gravity, Urine 1.022  1.005 - 1.030   pH 7.0  5.0 - 8.0   Glucose, UA NEGATIVE  NEGATIVE mg/dL   Hgb urine dipstick NEGATIVE  NEGATIVE   Bilirubin Urine NEGATIVE  NEGATIVE   Ketones, ur NEGATIVE  NEGATIVE mg/dL   Protein, ur NEGATIVE  NEGATIVE mg/dL   Urobilinogen, UA 1.0  0.0 - 1.0 mg/dL   Nitrite NEGATIVE  NEGATIVE   Leukocytes, UA NEGATIVE  NEGATIVE  TROPONIN I      Result Value Ref Range   Troponin I <0.30  <0.30 ng/mL  CBC      Result Value Ref Range   WBC 8.1  4.0 - 10.5 K/uL   RBC 5.01  4.22 - 5.81 MIL/uL   Hemoglobin 15.5  13.0 - 17.0 g/dL   HCT 16.1  09.6 - 04.5 %   MCV 86.4  78.0 - 100.0 fL   MCH 30.9  26.0 - 34.0 pg   MCHC 35.8  30.0 - 36.0 g/dL   RDW 40.9  81.1 - 91.4 %   Platelets 214  150 - 400 K/uL  CREATININE, SERUM      Result Value Ref Range   Creatinine, Ser 1.14  0.50 - 1.35 mg/dL   GFR calc non Af Amer 69 (*) >90 mL/min   GFR calc Af Amer 80 (*) >90 mL/min     EKG/XRAY:   Primary read interpreted by Dr. Conley Rolls at [facility] No fx or dislocation   ASSESSMENT/PLAN: Encounter Diagnoses  Name Primary?  . Knee pain, bilateral Yes  . 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 Patient is aware that all medications have potential sideeffects and we are unable to predict every sideeffect or drug-drug interaction that may occur.  Rockne Coons, DO 09/17/2013 5:33 PM

## 2013-09-20 ENCOUNTER — Encounter (HOSPITAL_COMMUNITY): Payer: Self-pay | Admitting: Emergency Medicine

## 2013-09-20 DIAGNOSIS — Z98811 Dental restoration status: Secondary | ICD-10-CM | POA: Insufficient documentation

## 2013-09-20 DIAGNOSIS — Z79899 Other long term (current) drug therapy: Secondary | ICD-10-CM | POA: Diagnosis not present

## 2013-09-20 DIAGNOSIS — I10 Essential (primary) hypertension: Secondary | ICD-10-CM | POA: Diagnosis not present

## 2013-09-20 DIAGNOSIS — K429 Umbilical hernia without obstruction or gangrene: Secondary | ICD-10-CM | POA: Insufficient documentation

## 2013-09-20 DIAGNOSIS — Z789 Other specified health status: Secondary | ICD-10-CM | POA: Insufficient documentation

## 2013-09-20 DIAGNOSIS — R109 Unspecified abdominal pain: Secondary | ICD-10-CM | POA: Diagnosis present

## 2013-09-20 LAB — COMPREHENSIVE METABOLIC PANEL
ALT: 25 U/L (ref 0–53)
AST: 20 U/L (ref 0–37)
Albumin: 3.7 g/dL (ref 3.5–5.2)
Alkaline Phosphatase: 51 U/L (ref 39–117)
BUN: 11 mg/dL (ref 6–23)
CALCIUM: 9 mg/dL (ref 8.4–10.5)
CO2: 23 mEq/L (ref 19–32)
Chloride: 103 mEq/L (ref 96–112)
Creatinine, Ser: 1.09 mg/dL (ref 0.50–1.35)
GFR, EST AFRICAN AMERICAN: 84 mL/min — AB (ref >90)
GFR, EST NON AFRICAN AMERICAN: 72 mL/min — AB (ref >90)
GLUCOSE: 138 mg/dL — AB (ref 70–99)
Potassium: 3.2 mEq/L — ABNORMAL LOW (ref 3.7–5.3)
Sodium: 142 mEq/L (ref 137–147)
Total Bilirubin: 0.2 mg/dL — ABNORMAL LOW (ref 0.3–1.2)
Total Protein: 6.8 g/dL (ref 6.0–8.3)

## 2013-09-20 LAB — URINALYSIS, ROUTINE W REFLEX MICROSCOPIC
Bilirubin Urine: NEGATIVE
Glucose, UA: NEGATIVE mg/dL
HGB URINE DIPSTICK: NEGATIVE
Ketones, ur: NEGATIVE mg/dL
Leukocytes, UA: NEGATIVE
Nitrite: NEGATIVE
PROTEIN: NEGATIVE mg/dL
Specific Gravity, Urine: 1.026 (ref 1.005–1.030)
UROBILINOGEN UA: 0.2 mg/dL (ref 0.0–1.0)
pH: 5.5 (ref 5.0–8.0)

## 2013-09-20 LAB — CBC WITH DIFFERENTIAL/PLATELET
Basophils Absolute: 0 10*3/uL (ref 0.0–0.1)
Basophils Relative: 0 % (ref 0–1)
EOS PCT: 5 % (ref 0–5)
Eosinophils Absolute: 0.3 10*3/uL (ref 0.0–0.7)
HCT: 39.9 % (ref 39.0–52.0)
Hemoglobin: 13.9 g/dL (ref 13.0–17.0)
LYMPHS PCT: 54 % — AB (ref 12–46)
Lymphs Abs: 2.9 10*3/uL (ref 0.7–4.0)
MCH: 29.8 pg (ref 26.0–34.0)
MCHC: 34.8 g/dL (ref 30.0–36.0)
MCV: 85.4 fL (ref 78.0–100.0)
MONO ABS: 0.5 10*3/uL (ref 0.1–1.0)
Monocytes Relative: 9 % (ref 3–12)
Neutro Abs: 1.7 10*3/uL (ref 1.7–7.7)
Neutrophils Relative %: 31 % — ABNORMAL LOW (ref 43–77)
Platelets: 204 10*3/uL (ref 150–400)
RBC: 4.67 MIL/uL (ref 4.22–5.81)
RDW: 13 % (ref 11.5–15.5)
WBC: 5.3 10*3/uL (ref 4.0–10.5)

## 2013-09-20 LAB — LIPASE, BLOOD: Lipase: 31 U/L (ref 11–59)

## 2013-09-20 NOTE — ED Notes (Signed)
Pt states he has been having progressively worsening left flank pain for two weeks.  Pt states medication he is taking for his knees has not been helping.

## 2013-09-21 ENCOUNTER — Emergency Department (HOSPITAL_COMMUNITY): Payer: PRIVATE HEALTH INSURANCE

## 2013-09-21 ENCOUNTER — Emergency Department (HOSPITAL_COMMUNITY)
Admission: EM | Admit: 2013-09-21 | Discharge: 2013-09-21 | Disposition: A | Discharge status: Home / Self Care | Payer: PRIVATE HEALTH INSURANCE | Attending: Emergency Medicine | Admitting: Emergency Medicine

## 2013-09-21 DIAGNOSIS — R109 Unspecified abdominal pain: Secondary | ICD-10-CM | POA: Diagnosis present

## 2013-09-21 MED ORDER — IBUPROFEN 800 MG PO TABS: 800.0000 mg | ORAL_TABLET | Freq: Three times a day (TID) | ORAL | Status: DC

## 2013-09-21 MED ORDER — FENTANYL CITRATE 0.05 MG/ML IJ SOLN
50.0000 ug | Freq: Once | INTRAMUSCULAR | Status: AC
  Administered 2013-09-21: 50 ug via INTRAVENOUS
  Filled 2013-09-21: qty 2

## 2013-09-21 MED ORDER — SODIUM CHLORIDE 0.9 % IV BOLUS (SEPSIS)
1000.0000 mL | INTRAVENOUS | Status: AC
  Administered 2013-09-21: 1000 mL via INTRAVENOUS

## 2013-09-21 MED ORDER — OXYCODONE-ACETAMINOPHEN 5-325 MG PO TABS: 1.0000 | ORAL_TABLET | Freq: Four times a day (QID) | ORAL | Status: DC | PRN

## 2013-09-21 NOTE — ED Provider Notes (Signed)
CSN: [id number]     Arrival date & time 09/20/13  2111 History   First MD Initiated Contact with Patient 09/21/13 0041     Chief Complaint  Patient presents with  . Flank Pain     (Consider location/radiation/quality/duration/timing/severity/associated sxs/prior Treatment) Patient is a 58 y.o. male presenting with flank pain. The history is provided by the patient.  Flank Pain This is a new problem. Episode onset: 2 weeks ago. The problem occurs constantly. The problem has been gradually worsening. Pertinent negatives include no chest pain, no abdominal pain, no headaches and no shortness of breath. Nothing aggravates the symptoms. Nothing relieves the symptoms. Treatments tried: meloxicam. The treatment provided no relief.    Past Medical History  Diagnosis Date  . Hypertension   . Hernia, umbilical 03/2013  . Wears glasses   . Full dentures    Past Surgical History  Procedure Laterality Date  . Elbow arthroscopy Left   . Cardiac catheterization  1990    cone-cp-  . Umbilical hernia repair N/A 04/15/2013    Procedure: HERNIA REPAIR UMBILICAL ADULT;  Surgeon: Shelly Rubensteinouglas A Blackman, MD;  Location: [facility];  Service: General;  Laterality: N/A;  . Insertion of mesh N/A 04/15/2013    Procedure: INSERTION OF MESH;  Surgeon: Shelly Rubensteinouglas A Blackman, MD;  Location: [facility];  Service: General;  Laterality: N/A;   Family History  Problem Relation Age of Onset  . Cancer Father     stomach   History  Substance Use Topics  . Smoking status: Never Smoker   . Smokeless tobacco: Never Used  . Alcohol Use: No    Review of Systems  Constitutional: Negative for fever.  HENT: Negative for drooling and rhinorrhea.   Eyes: Negative for pain.  Respiratory: Negative for cough and shortness of breath.   Cardiovascular: Negative for chest pain and leg swelling.  Gastrointestinal: Negative for nausea, vomiting, abdominal pain and diarrhea.  Genitourinary: Positive for  flank pain. Negative for dysuria and hematuria.  Musculoskeletal: Negative for gait problem and neck pain.  Skin: Negative for color change.  Neurological: Negative for numbness and headaches.  Hematological: Negative for adenopathy.  Psychiatric/Behavioral: Negative for behavioral problems.  All other systems reviewed and are negative.      Allergies  Morphine and related and Oxycodone  Home Medications   Current Outpatient Rx  Name  Route  Sig  Dispense  Refill  . hyoscyamine (ANASPAZ) 0.125 MG TBDP tablet   Sublingual   Place 0.125 mg under the tongue every 4 (four) hours as needed for bladder spasms.          . lansoprazole (PREVACID) 15 MG capsule   Oral   Take 15 mg by mouth daily.         Marland Kitchen. losartan (COZAAR) 50 MG tablet               . meloxicam (MOBIC) 15 MG tablet   Oral   Take 1 tablet (15 mg total) by mouth daily. No other NSAIDs. Take with Food   30 tablet   0   . EXPIRED: carvedilol (COREG) 3.125 MG tablet   Oral   Take 1 tablet (3.125 mg total) by mouth 2 (two) times daily with a meal.   60 tablet   0     NEEDS OFFICE VISIT   . EXPIRED: chlorthalidone (HYGROTON) 25 MG tablet   Oral   Take 1 tablet (25 mg total) by mouth daily.   30  tablet   2    BP 125/79  Pulse 75  Temp(Src) 97.2 F (36.2 C) (Oral)  Resp 18  Ht 5\' 6"  (1.676 m)  Wt 248 lb 1.6 oz (112.537 kg)  BMI 40.06 kg/m2  SpO2 98% Physical Exam  Nursing note and vitals reviewed. Constitutional: He is oriented to person, place, and time. He appears well-developed and well-nourished.  HENT:  Head: Normocephalic and atraumatic.  Right Ear: External ear normal.  Left Ear: External ear normal.  Nose: Nose normal.  Mouth/Throat: Oropharynx is clear and moist. No oropharyngeal exudate.  Eyes: Conjunctivae and EOM are normal. Pupils are equal, round, and reactive to light.  Neck: Normal range of motion. Neck supple.  Cardiovascular: Normal rate, regular rhythm, normal heart  sounds and intact distal pulses.  Exam reveals no gallop and no friction rub.   No murmur heard. Pulmonary/Chest: Effort normal and breath sounds normal. No respiratory distress. He has no wheezes.  Abdominal: Soft. Bowel sounds are normal. He exhibits no distension. There is no tenderness. There is no rebound and no guarding.  Musculoskeletal: Normal range of motion. He exhibits no edema and no tenderness.  The patient points to a single spot in his left posterior flank where he feels the pain. This area does not have any tenderness to palpation. It is mildly reproduced w/ twisting of the torso.   Neurological: He is alert and oriented to person, place, and time.  Skin: Skin is warm and dry.  Psychiatric: He has a normal mood and affect. His behavior is normal.    ED Course  Procedures (including critical care time) Labs Review Labs Reviewed  CBC WITH DIFFERENTIAL - Abnormal; Notable for the following:    Neutrophils Relative % 31 (*)    Lymphocytes Relative 54 (*)    All other components within normal limits  COMPREHENSIVE METABOLIC PANEL - Abnormal; Notable for the following:    Potassium 3.2 (*)    Glucose, Bld 138 (*)    Total Bilirubin 0.2 (*)    GFR calc non Af Amer 72 (*)    GFR calc Af Amer 84 (*)    All other components within normal limits  LIPASE, BLOOD  URINALYSIS, ROUTINE W REFLEX MICROSCOPIC   Imaging Review Ct Abdomen Pelvis Wo Contrast  09/21/2013   CLINICAL DATA:  Left flank pain for 2 we  EXAM: CT ABDOMEN AND PELVIS WITHOUT CONTRAST  TECHNIQUE: Multidetector CT imaging of the abdomen and pelvis was performed following the standard protocol without intravenous contrast.  COMPARISON:  04/17/2013  FINDINGS: BODY WALL: Reduced/for paired umbilical hernia.  LOWER CHEST: Unremarkable.  ABDOMEN/PELVIS:  Liver: No focal abnormality.  Biliary: No evidence of biliary obstruction or stone. Thickening/narrowing at the gallbladder fundus is stable from prior and likely focal  adenomyomatosis.  Pancreas: Unremarkable.  Spleen: Unremarkable.  Adrenals: Unremarkable.  Kidneys and ureters: No hydronephrosis or stone.  Bladder: Unremarkable.  Reproductive: Mild enlargement, up lifting the bladder base, stable from prior.  Bowel: Numerous noninflamed distal colonic diverticula. Normal appendix.  Retroperitoneum: No mass or adenopathy.  Peritoneum: No free fluid or gas.  Vascular: No acute abnormality.  OSSEOUS: No acute abnormalities.  IMPRESSION: 1. No acute findings to explain flank pain. 2. Colonic diverticulosis.   Electronically Signed   By: Tiburcio Pea M.D.   On: 09/21/2013 01:59     EKG Interpretation None      MDM   Final diagnoses:  Left flank pain    1:04 AM 58 y.o. male  who presents with left flank pain which began 2 weeks ago. He states the pain is constant but does fluctuate in intensity. He denies any fevers, vomiting, diarrhea, or other associated symptoms. He presents now due to ongoing pain. He is afebrile and vital signs are unremarkable here. Will get screening labwork and evaluate for kidney stone.  3:37 AM: I interpreted/reviewed the labs and/or imaging which were non-contributory. Possibly msk cause of pt's pain as it is reproducible w/ twisting of torso. Will provide stronger pain control and recommend rest. I have discussed the diagnosis/risks/treatment options with the patient and believe the pt to be eligible for discharge home to follow-up with his pcp in 2-3 days. We also discussed returning to the ED immediately if new or worsening sx occur. We discussed the sx which are most concerning (e.g., fever, worsening pain, vomiting) that necessitate immediate return. Medications administered to the patient during their visit and any new prescriptions provided to the patient are listed below.  Medications given during this visit Medications  sodium chloride 0.9 % bolus 1,000 mL (1,000 mLs Intravenous New Bag/Given 09/21/13 0122)  fentaNYL (SUBLIMAZE)  injection 50 mcg (50 mcg Intravenous Given 09/21/13 0123)  fentaNYL (SUBLIMAZE) injection 50 mcg (50 mcg Intravenous Given 09/21/13 0255)    Discharge Medication List as of 09/21/2013  3:38 AM    START taking these medications   Details  oxyCODONE-acetaminophen (PERCOCET) 5-325 MG per tablet Take 1 tablet by mouth every 6 (six) hours as needed for moderate pain., Starting 09/21/2013, Until Discontinued, Print         Junius Argyle, MD 09/21/13 2007

## 2013-09-21 NOTE — Discharge Instructions (Signed)

## 2015-02-22 ENCOUNTER — Ambulatory Visit (INDEPENDENT_AMBULATORY_CARE_PROVIDER_SITE_OTHER): Payer: 59 | Admitting: Family Medicine

## 2015-02-22 VITALS — BP 126/88 | HR 89 | Temp 98.3°F | Resp 16 | Ht 67.0 in | Wt 254.0 lb

## 2015-02-22 DIAGNOSIS — J209 Acute bronchitis, unspecified: Secondary | ICD-10-CM | POA: Diagnosis not present

## 2015-02-22 MED ORDER — AZITHROMYCIN 250 MG PO TABS: ORAL_TABLET | ORAL | Status: DC

## 2015-02-22 MED ORDER — BENZONATATE 100 MG PO CAPS: 100.0000 mg | ORAL_CAPSULE | Freq: Three times a day (TID) | ORAL | Status: DC | PRN

## 2015-02-22 MED ORDER — IPRATROPIUM BROMIDE 0.02 % IN SOLN: 0.5000 mg | Freq: Once | RESPIRATORY_TRACT | Status: AC

## 2015-02-22 MED ORDER — HYDROCOD POLST-CPM POLST ER 10-8 MG/5ML PO SUER: 5.0000 mL | Freq: Every evening | ORAL | Status: DC | PRN

## 2015-02-22 MED ORDER — ALBUTEROL SULFATE (2.5 MG/3ML) 0.083% IN NEBU: 2.5000 mg | INHALATION_SOLUTION | Freq: Once | RESPIRATORY_TRACT | Status: AC

## 2015-02-22 NOTE — Progress Notes (Signed)
Subjective:   This chart was scribed for Dr. Norberto Sorenson, MD by Jarvis Morgan, ED Scribe. This patient was seen in Room 10 and the patient's care was started at 11:39 AM.   Patient ID: Jacob Whitaker, male    DOB: 1955/08/18, 59 y.o.   MRN: [id number]  Chief Complaint  Patient presents with  . Shortness of Breath    x 2day  . Nasal Congestion    x 2days  . Cough    clear phelm   HPI HPI Comments: Jacob Whitaker is a 59 y.o. male who presents to the [facility] complaining of intermittent, moderate, cough productive of clear phlegm onset 2 days. He is complaining of associated SOB, wheezing, and chills. He states the cough has been keeping him up at night. He denies nay h/o asthma or smoking. He has had to use an inhaler in the past for bronchitis which he states provided no relief. He states he has tried the inhaler over the past 2 days with no relief. He has been using OTC Primatene with no relief. He denies any sick contacts. He denies any fever, nasal congestion, ear pain, sore throat, heart palpitations, hemoptysis, nausea, vomiting or diarrhea.    Patient Active Problem List   Diagnosis Date Noted  . Left flank pain 09/21/2013  . Umbilical hernia with obstruction 03/28/2013  . HTN (hypertension) 03/28/2013   Past Medical History  Diagnosis Date  . Hypertension   . Hernia, umbilical 03/2013  . Wears glasses   . Full dentures    Past Surgical History  Procedure Laterality Date  . Elbow arthroscopy Left   . Cardiac catheterization  1990    cone-cp-  . Umbilical hernia repair N/A 04/15/2013    Procedure: HERNIA REPAIR UMBILICAL ADULT;  Surgeon: Shelly Rubenstein, MD;  Location: [facility];  Service: General;  Laterality: N/A;  . Insertion of mesh N/A 04/15/2013    Procedure: INSERTION OF MESH;  Surgeon: Shelly Rubenstein, MD;  Location: [facility];  Service: General;  Laterality: N/A;   Allergies  Allergen Reactions  .  Morphine And Related Nausea Only  . Oxycodone Nausea Only   Prior to Admission medications   Medication Sig Start Date End Date Taking? Authorizing Provider  hyoscyamine (ANASPAZ) 0.125 MG TBDP tablet Place 0.125 mg under the tongue every 4 (four) hours as needed for bladder spasms.    Yes Historical Provider, MD  ibuprofen (ADVIL,MOTRIN) 800 MG tablet Take 1 tablet (800 mg total) by mouth 3 (three) times daily. 09/21/13  Yes Purvis Sheffield, MD  lansoprazole (PREVACID) 15 MG capsule Take 15 mg by mouth daily.   Yes Historical Provider, MD  losartan (COZAAR) 50 MG tablet  02/10/13  Yes Historical Provider, MD   History   Social History  . Marital Status: Married    Spouse Name: N/A  . Number of Children: N/A  . Years of Education: N/A   Occupational History  . Not on file.   Social History Main Topics  . Smoking status: Never Smoker   . Smokeless tobacco: Never Used  . Alcohol Use: No  . Drug Use: No  . Sexual Activity: Not on file   Other Topics Concern  . Not on file   Social History Narrative    Depression screen [facility] 2/9 02/22/2015  Decreased Interest 0  Down, Depressed, Hopeless 0  PHQ - 2 Score 0      Review of Systems  Constitutional: Positive for chills, activity change and fatigue. Negative for fever and appetite change.  HENT: Positive for congestion and postnasal drip. Negative for ear pain, facial swelling, nosebleeds, rhinorrhea, sinus pressure, sore throat, trouble swallowing and voice change.   Respiratory: Positive for cough, shortness of breath and wheezing. Negative for chest tightness.   Cardiovascular: Negative for chest pain and palpitations.  Gastrointestinal: Negative for nausea, vomiting and diarrhea.  Genitourinary: Negative for hematuria, decreased urine volume and genital sores.  Psychiatric/Behavioral: Positive for sleep disturbance. Negative for dysphoric mood.       Objective:  BP 126/88 mmHg  Pulse 89  Temp(Src) 98.3 F (36.8 C)  (Oral)  Resp 16  Ht  (1.702 m)  Wt 254 lb (115.214 kg)  BMI 39.77 kg/m2  SpO2 98%    Physical Exam  Constitutional: He is oriented to person, place, and time. He appears well-developed and well-nourished. No distress.  HENT:  Head: Normocephalic and atraumatic.  Nose: Mucosal edema present.  Mouth/Throat: Uvula is midline. Posterior oropharyngeal erythema present.  Nasal mucosa edema and erythema Oropharynx with 2+ tonsils and erythema  Eyes: Conjunctivae and EOM are normal.  Neck: Neck supple. No tracheal deviation present. No thyroid mass and no thyromegaly present.  Cardiovascular: Normal rate, regular rhythm, S1 normal, S2 normal and normal heart sounds.   No murmur heard. Pulmonary/Chest: Effort normal. No respiratory distress.  Lungs CTA with frequent consistent hacking cough with all exhalation  Musculoskeletal: Normal range of motion.  Lymphadenopathy:       Head (right side): Tonsillar adenopathy present.       Head (left side): Tonsillar adenopathy present.    He has no cervical adenopathy.       Right: No supraclavicular adenopathy present.       Left: No supraclavicular adenopathy present.  Neurological: He is alert and oriented to person, place, and time.  Skin: Skin is warm and dry.  Psychiatric: He has a normal mood and affect. His behavior is normal.  Nursing note and vitals reviewed. Repeat exam after nebulizer Excellent air movement, subtle expiatory wheezing in bilateral lower lobes    Assessment & Plan:   1. Acute bronchitis, unspecified organism     Meds ordered this encounter  Medications  . albuterol (PROVENTIL) (2.5 MG/3ML) 0.083% nebulizer solution 2.5 mg    Sig:   . ipratropium (ATROVENT) nebulizer solution 0.5 mg    Sig:   . DISCONTD: azithromycin (ZITHROMAX) 250 MG tablet    Sig: Take 2 tabs PO x 1 dose, then 1 tab PO QD x 4 days    Dispense:  6 tablet    Refill:  0  . benzonatate (TESSALON) 100 MG capsule    Sig: Take 1-2 capsules  (100-200 mg total) by mouth 3 (three) times daily as needed for cough.    Dispense:  60 capsule    Refill:  0  . chlorpheniramine-HYDROcodone (TUSSIONEX PENNKINETIC ER) 10-8 MG/5ML SUER    Sig: Take 5 mLs by mouth at bedtime as needed for cough.    Dispense:  120 mL    Refill:  0  . azithromycin (ZITHROMAX) 250 MG tablet    Sig: Take 2 tabs PO x 1 dose, then 1 tab PO QD x 4 days    Dispense:  6 tablet    Refill:  0    I personally performed the services described in this documentation, which was scribed in my presence. The recorded information has been reviewed and considered, and addended  by me as needed.  Delman Cheadle, MD MPH

## 2015-02-22 NOTE — Patient Instructions (Signed)

## 2015-10-10 ENCOUNTER — Ambulatory Visit (INDEPENDENT_AMBULATORY_CARE_PROVIDER_SITE_OTHER): Payer: 59

## 2015-10-10 ENCOUNTER — Ambulatory Visit (INDEPENDENT_AMBULATORY_CARE_PROVIDER_SITE_OTHER): Payer: 59 | Admitting: Emergency Medicine

## 2015-10-10 ENCOUNTER — Other Ambulatory Visit: Payer: Self-pay | Admitting: Emergency Medicine

## 2015-10-10 VITALS — BP 120/80 | HR 98 | Temp 98.6°F | Resp 16 | Ht 67.0 in | Wt 256.0 lb

## 2015-10-10 DIAGNOSIS — R0789 Other chest pain: Secondary | ICD-10-CM

## 2015-10-10 DIAGNOSIS — R1013 Epigastric pain: Secondary | ICD-10-CM

## 2015-10-10 LAB — COMPREHENSIVE METABOLIC PANEL
ALBUMIN: 4.7 g/dL (ref 3.6–5.1)
ALK PHOS: 56 U/L (ref 40–115)
ALT: 47 U/L — AB (ref 9–46)
AST: 31 U/L (ref 10–35)
BUN: 11 mg/dL (ref 7–25)
CALCIUM: 10.1 mg/dL (ref 8.6–10.3)
CO2: 29 mmol/L (ref 20–31)
Chloride: 103 mmol/L (ref 98–110)
Creat: 1.23 mg/dL (ref 0.70–1.25)
GLUCOSE: 100 mg/dL — AB (ref 65–99)
POTASSIUM: 4.3 mmol/L (ref 3.5–5.3)
Sodium: 141 mmol/L (ref 135–146)
Total Bilirubin: 0.6 mg/dL (ref 0.2–1.2)
Total Protein: 7.2 g/dL (ref 6.1–8.1)

## 2015-10-10 LAB — POCT CBC
Granulocyte percent: 37.4 %G (ref 37–80)
HCT, POC: 42.7 % — AB (ref 43.5–53.7)
Hemoglobin: 15.4 g/dL (ref 14.1–18.1)
Lymph, poc: 2.7 (ref 0.6–3.4)
MCH: 30.9 pg (ref 27–31.2)
MCHC: 36.1 g/dL — AB (ref 31.8–35.4)
MCV: 85.5 fL (ref 80–97)
MID (CBC): 0.4 (ref 0–0.9)
MPV: 8.5 fL (ref 0–99.8)
PLATELET COUNT, POC: 192 10*3/uL (ref 142–424)
POC Granulocyte: 1.9 — AB (ref 2–6.9)
POC LYMPH PERCENT: 54.9 %L — AB (ref 10–50)
POC MID %: 7.7 % (ref 0–12)
RBC: 4.99 M/uL (ref 4.69–6.13)
RDW, POC: 13 %
WBC: 5 10*3/uL (ref 4.6–10.2)

## 2015-10-10 LAB — LIPASE: Lipase: 19 U/L (ref 7–60)

## 2015-10-10 LAB — H. PYLORI BREATH TEST: H. PYLORI BREATH TEST: NOT DETECTED

## 2015-10-10 MED ORDER — SUCRALFATE 1 GM/10ML PO SUSP: ORAL | Status: DC

## 2015-10-10 NOTE — Patient Instructions (Addendum)
     IF you received an x-ray today, you will receive an invoice from [facility]. Please contact [facility] at [phone] with questions or concerns regarding your invoice.   IF you received labwork today, you will receive an invoice from [facility]. Please contact [facility] at [phone] with questions or concerns regarding your invoice.   Our billing staff will not be able to assist you with questions regarding bills from these companies.  You will be contacted with the lab results as soon as they are available. The fastest way to get your results is to activate your My Chart account. Instructions are located on the last page of this paperwork. If you have not heard from us regarding the results in 2 weeks, please contact this office.    I have done a test for H. pylori which is pending. I have sent a prescription in for Carafate to take for your GI discomfort.

## 2015-10-10 NOTE — Progress Notes (Addendum)
By signing my name below, I, Raven Small, attest that this documentation has been prepared under the direction and in the presence of Lesle ChrisSteven Daub, MD.  Electronically Signed: Andrew Auaven Small, ED Scribe. 10/09/2015. 10:30 AM.   Chief Complaint:  Chief Complaint  Patient presents with  . other    thightness in chest/ non emergent  . Abdominal Pain    stomach pain/ x 1 week  . Medication Refill    hyoscyamine  . Immunizations    flu shot    HPI: Jacob Whitaker is a 60 y.o. male who [facility] complaining of upper abdominal pain onset 6-7 days ago. Pt states he's had upper abdominal pain for 1 week, mild chest pressure and sinus drainage. He also reports aching in bilateral shoulder but suspects this is from work. 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 He states he noticed onset of symptoms after eating  [employer] last week. He reports hx of h pylori and umbilical hernia with hx of hernia repair. He denies fever, chills, sore throat and cough. Pt is not a smoker or drinker. He denies any exertional chest pain or pressure while working.   Past Medical History  Diagnosis Date  . Hypertension   . Hernia, umbilical 03/2013  . Wears glasses   . Full dentures    Past Surgical History  Procedure Laterality Date  . Elbow arthroscopy Left   . Cardiac catheterization  1990    cone-cp-  . Umbilical hernia repair N/A 04/15/2013    Procedure: HERNIA REPAIR UMBILICAL ADULT;  Surgeon: Shelly Rubensteinouglas A Blackman, MD;  Location: [facility];  Service: General;  Laterality: N/A;  . Insertion of mesh N/A 04/15/2013    Procedure: INSERTION OF MESH;  Surgeon: Shelly Rubensteinouglas A Blackman, MD;  Location: [facility];  Service: General;  Laterality: N/A;   Social History   Social History  . Marital Status: Married    Spouse Name: N/A  . Number of Children: N/A  . Years of Education: N/A   Social History Main Topics  . Smoking status: Never Smoker   . Smokeless tobacco: Never Used  . Alcohol  Use: No  . Drug Use: No  . Sexual Activity: Not Asked   Other Topics Concern  . None   Social History Narrative   Family History  Problem Relation Age of Onset  . Cancer Father     stomach   Allergies  Allergen Reactions  . Morphine And Related Nausea Only  . Oxycodone Nausea Only   Prior to Admission medications   Medication Sig Start Date End Date Taking? Authorizing Provider  azithromycin (ZITHROMAX) 250 MG tablet Take 2 tabs PO x 1 dose, then 1 tab PO QD x 4 days 02/22/15  Yes Sherren MochaEva N Shaw, MD  hyoscyamine (ANASPAZ) 0.125 MG TBDP tablet Place 0.125 mg under the tongue every 4 (four) hours as needed for bladder spasms.    Yes Historical Provider, MD  ibuprofen (ADVIL,MOTRIN) 800 MG tablet Take 1 tablet (800 mg total) by mouth 3 (three) times daily. 09/21/13  Yes Purvis SheffieldForrest Harrison, MD  losartan (COZAAR) 50 MG tablet  02/10/13  Yes Historical Provider, MD  benzonatate (TESSALON) 100 MG capsule Take 1-2 capsules (100-200 mg total) by mouth 3 (three) times daily as needed for cough. Patient not taking: Reported on 10/10/2015 02/22/15   Sherren MochaEva N Shaw, MD  chlorpheniramine-HYDROcodone [facility](TUSSIONEX PENNKINETIC ER) 10-8 MG/5ML SUER Take 5 mLs by mouth at bedtime as needed for cough.  Patient not taking: Reported on 10/10/2015 02/22/15   Sherren Mocha, MD  lansoprazole (PREVACID) 15 MG capsule Take 15 mg by mouth daily. Reported on 10/10/2015    Historical Provider, MD     ROS: The patient denies fevers, chills, night sweats, unintentional weight loss, palpitations, wheezing, dyspnea on exertion, nausea, vomiting, dysuria, hematuria, melena, numbness, weakness, or tingling.   All other systems have been reviewed and were otherwise negative with the exception of those mentioned in the HPI and as above.    PHYSICAL EXAM: Filed Vitals:   10/10/15 0958  BP: 120/80  Pulse: 98  Temp: 98.6 F (37 C)  Resp: 16   Body mass index is 40.09 kg/(m^2).   General: Alert, no acute distress HEENT:  Normocephalic,  atraumatic, oropharynx patent. Eye: Nonie Hoyer [facility] Cardiovascular:  Regular rate and rhythm, no rubs murmurs or gallops.  No Carotid bruits, radial pulse intact. No pedal edema.  Respiratory: Clear to auscultation bilaterally.  No wheezes, rales, or rhonchi.  No cyanosis, no use of accessory musculature Abdominal: No organomegaly, abdomen is soft. positive bowel sounds.  No masses. Mild mid epigastric discomfort.  Musculoskeletal: Gait intact. No edema, tenderness Skin: No rashes. Neurologic: Facial musculature symmetric. Psychiatric: Patient acts appropriately throughout our interaction. Lymphatic: No cervical or submandibular lymphadenopathy    LABS: Results for orders placed or performed in visit on 10/10/15  POCT CBC  Result Value Ref Range   WBC 5.0 4.6 - 10.2 K/uL   Lymph, poc 2.7 0.6 - 3.4   POC LYMPH PERCENT 54.9 (A) 10 - 50 %L   MID (cbc) 0.4 0 - 0.9   POC MID % 7.7 0 - 12 %M   POC Granulocyte 1.9 (A) 2 - 6.9   Granulocyte percent 37.4 37 - 80 %G   RBC 4.99 4.69 - 6.13 M/uL   Hemoglobin 15.4 14.1 - 18.1 g/dL   HCT, POC 16.1 (A) 09.6 - 53.7 %   MCV 85.5 80 - 97 fL   MCH, POC 30.9 27 - 31.2 pg   MCHC 36.1 (A) 31.8 - 35.4 g/dL   RDW, POC 04.5 %   Platelet Count, POC 192 142 - 424 K/uL   MPV 8.5 0 - 99.8 fL     EKG/XRAY:   Primary read interpreted by Dr. Cleta Alberts at [facility]. Normal sinus rhythm Dg Chest 2 View  10/10/2015  CLINICAL DATA:  Shortness of Breath EXAM: CHEST  2 VIEW COMPARISON:  03/28/2013 FINDINGS: Cardiomediastinal silhouette is stable. Chronic mild elevation the left hemidiaphragm again noted. No infiltrate or pulmonary edema. Stable left basilar atelectasis or scarring. Degenerative changes thoracic spine again noted. IMPRESSION: No active disease. Stable chronic mild elevation of the left hemidiaphragm with left base atelectasis or scarring. Degenerative changes thoracic spine. Electronically Signed   By: Natasha Mead M.D.   On: 10/10/2015 10:53     ASSESSMENT/PLAN: Tests for H. pylori sent for evaluation. I added Carafate to his Prevacid pending his H. pylori results.I personally performed the services described in this documentation, which was scribed in my presence. The recorded information has been reviewed and is accurate.   Gross sideeffects, risk and benefits, and alternatives of medications d/w patient. Patient is aware that all medications have potential sideeffects and we are unable to predict every sideeffect or drug-drug interaction that may occur.  Lesle Chris MD 10/10/2015 10:30 AM

## 2015-10-11 ENCOUNTER — Other Ambulatory Visit: Payer: Self-pay | Admitting: Emergency Medicine

## 2015-10-11 DIAGNOSIS — R1013 Epigastric pain: Secondary | ICD-10-CM

## 2015-10-16 LAB — HEPATITIS C ANTIBODY: HCV AB: NEGATIVE

## 2015-10-21 ENCOUNTER — Ambulatory Visit (INDEPENDENT_AMBULATORY_CARE_PROVIDER_SITE_OTHER): Payer: 59 | Admitting: Family Medicine

## 2015-10-21 VITALS — BP 130/80 | HR 104 | Temp 99.7°F | Resp 17 | Ht 67.5 in | Wt 252.0 lb

## 2015-10-21 DIAGNOSIS — J111 Influenza due to unidentified influenza virus with other respiratory manifestations: Secondary | ICD-10-CM

## 2015-10-21 MED ORDER — GUAIFENESIN ER 1200 MG PO TB12: 1.0000 | ORAL_TABLET | Freq: Two times a day (BID) | ORAL | Status: DC | PRN

## 2015-10-21 MED ORDER — OSELTAMIVIR PHOSPHATE 75 MG PO CAPS: 75.0000 mg | ORAL_CAPSULE | Freq: Two times a day (BID) | ORAL | Status: DC

## 2015-10-21 MED ORDER — HYDROCODONE-HOMATROPINE 5-1.5 MG/5ML PO SYRP: 5.0000 mL | ORAL_SOLUTION | Freq: Four times a day (QID) | ORAL | Status: DC | PRN

## 2015-10-21 NOTE — Patient Instructions (Addendum)
   IF you received an x-ray today, you will receive an invoice from [facility]. Please contact [facility] at [phone] with questions or concerns regarding your invoice.   IF you received labwork today, you will receive an invoice from [facility]. Please contact [facility] at [phone] with questions or concerns regarding your invoice.   Our billing staff will not be able to assist you with questions regarding bills from these companies.  You will be contacted with the lab results as soon as they are available. The fastest way to get your results is to activate your My Chart account. Instructions are located on the last page of this paperwork. If you have not heard from us regarding the results in 2 weeks, please contact this office.     Influenza, Adult Influenza ("the flu") is a viral infection of the respiratory tract. It occurs more often in winter months because people spend more time in close contact with one another. Influenza can make you feel very sick. Influenza easily spreads from person to person (contagious). CAUSES  Influenza is caused by a virus that infects the respiratory tract. You can catch the virus by breathing in droplets from an infected person's cough or sneeze. You can also catch the virus by touching something that was recently contaminated with the virus and then touching your mouth, nose, or eyes. RISKS AND COMPLICATIONS You may be at risk for a more severe case of influenza if you smoke cigarettes, have diabetes, have chronic heart disease (such as heart failure) or lung disease (such as asthma), or if you have a weakened immune system. Elderly people and pregnant women are also at risk for more serious infections. The most common problem of influenza is a lung infection (pneumonia). Sometimes, this problem can require emergency medical care and may be life threatening. SIGNS AND SYMPTOMS  Symptoms typically last 4  to 10 days and may include:  Fever.  Chills.  Headache, body aches, and muscle aches.  Sore throat.  Chest discomfort and cough.  Poor appetite.  Weakness or feeling tired.  Dizziness.  Nausea or vomiting. DIAGNOSIS  Diagnosis of influenza is often made based on your history and a physical exam. A nose or throat swab test can be done to confirm the diagnosis. TREATMENT  In mild cases, influenza goes away on its own. Treatment is directed at relieving symptoms. For more severe cases, your health care provider may prescribe antiviral medicines to shorten the sickness. Antibiotic medicines are not effective because the infection is caused by a virus, not by bacteria. HOME CARE INSTRUCTIONS  Take medicines only as directed by your health care provider.  Use a cool mist humidifier to make breathing easier.  Get plenty of rest until your temperature returns to normal. This usually takes 3 to 4 days.  Drink enough fluid to keep your urine clear or pale yellow.  Cover yourmouth and nosewhen coughing or sneezing,and wash your handswellto prevent thevirusfrom spreading.  Stay homefromwork orschool untilthe fever is gonefor at least 1full day. PREVENTION  An annual influenza vaccination (flu shot) is the best way to avoid getting influenza. An annual flu shot is now routinely recommended for all adults in the [place] SEEK MEDICAL CARE IF:  You experiencechest pain, yourcough worsens,or you producemore mucus.  Youhave nausea,vomiting, ordiarrhea.  Your fever returns or gets worse. SEEK IMMEDIATE MEDICAL CARE IF:  You havetrouble breathing, you become short of breath,or your skin ornails becomebluish.  You have severe painor   stiffnessin the neck.  You develop a sudden headache, or pain in the face or ear.  You have nausea or vomiting that you cannot control. MAKE SURE YOU:   Understand these instructions.  Will watch your condition.  Will get help  right away if you are not doing well or get worse.   This information is not intended to replace advice given to you by your health care provider. Make sure you discuss any questions you have with your health care provider.   Document Released: 07/04/2000 Document Revised: 07/28/2014 Document Reviewed: 10/06/2011 Elsevier Interactive Patient Education 2016 Elsevier Inc.  

## 2015-10-21 NOTE — Progress Notes (Signed)
Subjective:    Patient ID: Jacob Whitaker, male    DOB: 1955-10-22, 60 y.o.   MRN: [id number] By signing my name below, I, Javier Dockerobert Ryan Halas, attest that this documentation has been prepared under the direction and in the presence of Norberto SorensonEva Karem Tomaso, MD. Electronically Signed: Javier Dockerobert Ryan Halas, ER Scribe. 10/21/2015. 10:22 AM.  Chief Complaint  Patient presents with  . Headache  . Chills  . Fatigue    HPI HPI Comments: Jacob Whitaker is a 60 y.o. male who [facility] complaining of HA, fever, chills, myalgias, mild sore throat, and mild cough since last night. He states he feels like he has been hit by two trucks. He denies significant SOB or CP. He has not had a flu shot. He has not taken any medications.    Past Medical History  Diagnosis Date  . Hypertension   . Hernia, umbilical 03/2013  . Wears glasses   . Full dentures    Allergies  Allergen Reactions  . Morphine And Related Nausea Only  . Oxycodone Nausea Only   Current Outpatient Prescriptions on File Prior to Visit  Medication Sig Dispense Refill  . azithromycin (ZITHROMAX) 250 MG tablet Take 2 tabs PO x 1 dose, then 1 tab PO QD x 4 days 6 tablet 0  . ibuprofen (ADVIL,MOTRIN) 800 MG tablet Take 1 tablet (800 mg total) by mouth 3 (three) times daily. 30 tablet 0  . lansoprazole (PREVACID) 15 MG capsule Take 15 mg by mouth daily. Reported on 10/10/2015    . losartan (COZAAR) 50 MG tablet     . sucralfate (CARAFATE) 1 GM/10ML suspension Take 2 teaspoons up to 4 times a day on an empty stomach. 420 mL 0   Current Facility-Administered Medications on File Prior to Visit  Medication Dose Route Frequency Provider Last Rate Last Dose  . albuterol (PROVENTIL) (2.5 MG/3ML) 0.083% nebulizer solution 2.5 mg  2.5 mg Nebulization Once Sherren MochaEva N Betsie Peckman, MD      . ipratropium (ATROVENT) nebulizer solution 0.5 mg  0.5 mg Nebulization Once Sherren MochaEva N Stuart Guillen, MD        Review of Systems  Constitutional: Positive for fever, chills, diaphoresis,  activity change, appetite change and fatigue.  HENT: Positive for sore throat (Mild).   Respiratory: Positive for cough. Negative for chest tightness and shortness of breath (mild).   Cardiovascular: Negative for chest pain (Mild) and palpitations.  Gastrointestinal: Negative for vomiting.  Musculoskeletal: Positive for myalgias.  Neurological: Positive for headaches.  Hematological: Negative for adenopathy.  Psychiatric/Behavioral: Positive for sleep disturbance.      Objective:  BP 130/80 mmHg  Pulse 104  Temp(Src) 99.7 F (37.6 C) (Oral)  Resp 17  Ht 5' 7.5" (1.715 m)  Wt 252 lb (114.306 kg)  BMI 38.86 kg/m2  SpO2 96%  Physical Exam  Constitutional: He is oriented to person, place, and time. He appears well-developed and well-nourished. No distress.  HENT:  Head: Normocephalic and atraumatic.  Throat with mild erythema. TMs and Nares normal. Bilateral tonsillar adenopathy.   Eyes: Pupils are equal, round, and reactive to light.  Neck: Neck supple.  Cardiovascular: Normal rate and regular rhythm.   No murmur heard. Tachycardic  Pulmonary/Chest: Effort normal. No respiratory distress.  Good air movement  Musculoskeletal: Normal range of motion.  Neurological: He is alert and oriented to person, place, and time. Coordination normal.  Skin: Skin is warm and dry. He is not diaphoretic.  Psychiatric: He has a normal mood and  affect. His behavior is normal.  Nursing note and vitals reviewed.     Assessment & Plan:   1. Influenza     Meds ordered this encounter  Medications  . oseltamivir (TAMIFLU) 75 MG capsule    Sig: Take 1 capsule (75 mg total) by mouth 2 (two) times daily.    Dispense:  10 capsule    Refill:  0  . HYDROcodone-homatropine (HYCODAN) 5-1.5 MG/5ML syrup    Sig: Take 5 mLs by mouth every 6 (six) hours as needed for cough.    Dispense:  180 mL    Refill:  0  . Guaifenesin (MUCINEX MAXIMUM STRENGTH) 1200 MG TB12    Sig: Take 1 tablet (1,200 mg total)  by mouth every 12 (twelve) hours as needed.    Dispense:  14 tablet    Refill:  1    I personally performed the services described in this documentation, which was scribed in my presence. The recorded information has been reviewed and considered, and addended by me as needed.  Norberto Sorenson, MD MPH

## 2015-11-06 ENCOUNTER — Encounter: Payer: Self-pay | Admitting: Family Medicine

## 2016-08-11 DIAGNOSIS — J209 Acute bronchitis, unspecified: Secondary | ICD-10-CM | POA: Diagnosis not present

## 2016-10-22 DIAGNOSIS — I10 Essential (primary) hypertension: Secondary | ICD-10-CM | POA: Diagnosis not present

## 2016-10-22 DIAGNOSIS — E782 Mixed hyperlipidemia: Secondary | ICD-10-CM | POA: Diagnosis not present

## 2016-10-22 DIAGNOSIS — R7303 Prediabetes: Secondary | ICD-10-CM | POA: Diagnosis not present

## 2016-11-07 DIAGNOSIS — M5386 Other specified dorsopathies, lumbar region: Secondary | ICD-10-CM | POA: Diagnosis not present

## 2016-11-07 DIAGNOSIS — M9903 Segmental and somatic dysfunction of lumbar region: Secondary | ICD-10-CM | POA: Diagnosis not present

## 2016-11-07 DIAGNOSIS — M9905 Segmental and somatic dysfunction of pelvic region: Secondary | ICD-10-CM | POA: Diagnosis not present

## 2016-11-10 DIAGNOSIS — M5386 Other specified dorsopathies, lumbar region: Secondary | ICD-10-CM | POA: Diagnosis not present

## 2016-11-10 DIAGNOSIS — M9903 Segmental and somatic dysfunction of lumbar region: Secondary | ICD-10-CM | POA: Diagnosis not present

## 2016-11-10 DIAGNOSIS — M9905 Segmental and somatic dysfunction of pelvic region: Secondary | ICD-10-CM | POA: Diagnosis not present

## 2016-11-13 DIAGNOSIS — M9903 Segmental and somatic dysfunction of lumbar region: Secondary | ICD-10-CM | POA: Diagnosis not present

## 2016-11-13 DIAGNOSIS — M9905 Segmental and somatic dysfunction of pelvic region: Secondary | ICD-10-CM | POA: Diagnosis not present

## 2016-11-13 DIAGNOSIS — M5386 Other specified dorsopathies, lumbar region: Secondary | ICD-10-CM | POA: Diagnosis not present

## 2016-11-17 DIAGNOSIS — M9905 Segmental and somatic dysfunction of pelvic region: Secondary | ICD-10-CM | POA: Diagnosis not present

## 2016-11-17 DIAGNOSIS — M9903 Segmental and somatic dysfunction of lumbar region: Secondary | ICD-10-CM | POA: Diagnosis not present

## 2016-11-17 DIAGNOSIS — M5386 Other specified dorsopathies, lumbar region: Secondary | ICD-10-CM | POA: Diagnosis not present

## 2016-11-20 DIAGNOSIS — M9905 Segmental and somatic dysfunction of pelvic region: Secondary | ICD-10-CM | POA: Diagnosis not present

## 2016-11-20 DIAGNOSIS — M5386 Other specified dorsopathies, lumbar region: Secondary | ICD-10-CM | POA: Diagnosis not present

## 2016-11-20 DIAGNOSIS — M9903 Segmental and somatic dysfunction of lumbar region: Secondary | ICD-10-CM | POA: Diagnosis not present

## 2016-11-24 DIAGNOSIS — M9903 Segmental and somatic dysfunction of lumbar region: Secondary | ICD-10-CM | POA: Diagnosis not present

## 2016-11-24 DIAGNOSIS — M5386 Other specified dorsopathies, lumbar region: Secondary | ICD-10-CM | POA: Diagnosis not present

## 2016-11-24 DIAGNOSIS — M9905 Segmental and somatic dysfunction of pelvic region: Secondary | ICD-10-CM | POA: Diagnosis not present

## 2016-11-27 DIAGNOSIS — M9903 Segmental and somatic dysfunction of lumbar region: Secondary | ICD-10-CM | POA: Diagnosis not present

## 2016-11-27 DIAGNOSIS — M9905 Segmental and somatic dysfunction of pelvic region: Secondary | ICD-10-CM | POA: Diagnosis not present

## 2016-11-27 DIAGNOSIS — M5386 Other specified dorsopathies, lumbar region: Secondary | ICD-10-CM | POA: Diagnosis not present

## 2016-12-31 DIAGNOSIS — E782 Mixed hyperlipidemia: Secondary | ICD-10-CM | POA: Diagnosis not present

## 2016-12-31 DIAGNOSIS — R7303 Prediabetes: Secondary | ICD-10-CM | POA: Diagnosis not present

## 2017-01-02 DIAGNOSIS — I10 Essential (primary) hypertension: Secondary | ICD-10-CM | POA: Diagnosis not present

## 2017-01-02 DIAGNOSIS — E782 Mixed hyperlipidemia: Secondary | ICD-10-CM | POA: Diagnosis not present

## 2017-01-07 DIAGNOSIS — E1165 Type 2 diabetes mellitus with hyperglycemia: Secondary | ICD-10-CM | POA: Diagnosis not present

## 2017-01-07 DIAGNOSIS — E782 Mixed hyperlipidemia: Secondary | ICD-10-CM | POA: Diagnosis not present

## 2017-01-07 DIAGNOSIS — M150 Primary generalized (osteo)arthritis: Secondary | ICD-10-CM | POA: Diagnosis not present

## 2017-02-05 ENCOUNTER — Encounter: Payer: Self-pay | Admitting: Dietician

## 2017-02-05 ENCOUNTER — Encounter: Payer: 59 | Attending: Internal Medicine | Admitting: Dietician

## 2017-02-05 DIAGNOSIS — Z713 Dietary counseling and surveillance: Secondary | ICD-10-CM | POA: Insufficient documentation

## 2017-02-05 DIAGNOSIS — E119 Type 2 diabetes mellitus without complications: Secondary | ICD-10-CM

## 2017-02-05 DIAGNOSIS — Z119 Encounter for screening for infectious and parasitic diseases, unspecified: Secondary | ICD-10-CM | POA: Diagnosis not present

## 2017-02-05 NOTE — Progress Notes (Signed)
Patient was seen on 02/05/17 for the first of a series of three diabetes self-management courses at the [facility].  Patient Education Plan per assessed needs and concerns is to attend four course education program for Diabetes Self Management Education.  The following learning objectives were met by the patient during this class:  Describe diabetes  State some common risk factors for diabetes  Defines the role of glucose and insulin  Identifies type of diabetes and pathophysiology  Describe the relationship between diabetes and cardiovascular risk  State the members of the Healthcare Team  States the rationale for glucose monitoring  State when to test glucose  State their individual Target Range  State the importance of logging glucose readings  Describe how to interpret glucose readings  Identifies A1C target  Explain the correlation between A1c and eAG values  State symptoms and treatment of high blood glucose  State symptoms and treatment of low blood glucose  Explain proper technique for glucose testing  Identifies proper sharps disposal  Handouts given during class include:  Living Well with Diabetes book  Carb Counting and Meal Planning book  Meal Plan Card  Carbohydrate guide  Meal planning worksheet  Low Sodium Flavoring Tips  The diabetes portion plate  P3A to eAG Conversion Chart  Diabetes Medications  Diabetes Recommended Care Schedule  Support Group  Diabetes Success Plan  Core Class Satisfaction Survey   . The patient's Newest Vital Sign Health Literacy Assessment score was 3. . The patient scored 67% on the Diabetes Knowledge pre-test. . Educational strategies utilized during class included repetition, teach-back, eliminating medical jargon, and being open to questions.   Follow-Up Plan:  Attend core 2

## 2017-02-12 ENCOUNTER — Ambulatory Visit: Payer: PRIVATE HEALTH INSURANCE

## 2017-02-12 DIAGNOSIS — R072 Precordial pain: Secondary | ICD-10-CM | POA: Diagnosis not present

## 2017-02-12 DIAGNOSIS — K210 Gastro-esophageal reflux disease with esophagitis: Secondary | ICD-10-CM | POA: Diagnosis not present

## 2017-02-19 ENCOUNTER — Ambulatory Visit: Payer: PRIVATE HEALTH INSURANCE

## 2017-03-04 DIAGNOSIS — R1011 Right upper quadrant pain: Secondary | ICD-10-CM | POA: Diagnosis not present

## 2017-03-04 DIAGNOSIS — K567 Ileus, unspecified: Secondary | ICD-10-CM | POA: Diagnosis not present

## 2017-04-15 DIAGNOSIS — E782 Mixed hyperlipidemia: Secondary | ICD-10-CM | POA: Diagnosis not present

## 2017-04-15 DIAGNOSIS — E1165 Type 2 diabetes mellitus with hyperglycemia: Secondary | ICD-10-CM | POA: Diagnosis not present

## 2017-04-29 DIAGNOSIS — E1165 Type 2 diabetes mellitus with hyperglycemia: Secondary | ICD-10-CM | POA: Diagnosis not present

## 2017-04-29 DIAGNOSIS — E782 Mixed hyperlipidemia: Secondary | ICD-10-CM | POA: Diagnosis not present

## 2017-04-29 DIAGNOSIS — I10 Essential (primary) hypertension: Secondary | ICD-10-CM | POA: Diagnosis not present

## 2017-07-09 DIAGNOSIS — R079 Chest pain, unspecified: Secondary | ICD-10-CM | POA: Diagnosis not present

## 2017-07-16 DIAGNOSIS — R079 Chest pain, unspecified: Secondary | ICD-10-CM | POA: Diagnosis not present

## 2017-07-16 DIAGNOSIS — Z0189 Encounter for other specified special examinations: Secondary | ICD-10-CM | POA: Diagnosis not present

## 2017-07-16 DIAGNOSIS — I10 Essential (primary) hypertension: Secondary | ICD-10-CM | POA: Diagnosis not present

## 2017-08-19 DIAGNOSIS — I10 Essential (primary) hypertension: Secondary | ICD-10-CM | POA: Diagnosis not present

## 2017-08-19 DIAGNOSIS — R079 Chest pain, unspecified: Secondary | ICD-10-CM | POA: Diagnosis not present

## 2017-08-19 DIAGNOSIS — E7800 Pure hypercholesterolemia, unspecified: Secondary | ICD-10-CM | POA: Diagnosis not present

## 2017-09-27 ENCOUNTER — Encounter (HOSPITAL_COMMUNITY): Payer: Self-pay

## 2017-09-27 ENCOUNTER — Emergency Department (HOSPITAL_COMMUNITY): Payer: 59

## 2017-09-27 ENCOUNTER — Emergency Department (HOSPITAL_COMMUNITY)
Admission: EM | Admit: 2017-09-27 | Discharge: 2017-09-27 | Disposition: A | Discharge status: Home / Self Care | Payer: 59 | Attending: Emergency Medicine | Admitting: Emergency Medicine

## 2017-09-27 DIAGNOSIS — E119 Type 2 diabetes mellitus without complications: Secondary | ICD-10-CM | POA: Diagnosis not present

## 2017-09-27 DIAGNOSIS — R109 Unspecified abdominal pain: Secondary | ICD-10-CM | POA: Diagnosis not present

## 2017-09-27 DIAGNOSIS — I10 Essential (primary) hypertension: Secondary | ICD-10-CM | POA: Insufficient documentation

## 2017-09-27 DIAGNOSIS — M545 Low back pain, unspecified: Secondary | ICD-10-CM

## 2017-09-27 DIAGNOSIS — Z79899 Other long term (current) drug therapy: Secondary | ICD-10-CM | POA: Diagnosis not present

## 2017-09-27 DIAGNOSIS — R1031 Right lower quadrant pain: Secondary | ICD-10-CM | POA: Diagnosis present

## 2017-09-27 LAB — CBC WITH DIFFERENTIAL/PLATELET
Basophils Absolute: 0 10*3/uL (ref 0.0–0.1)
Basophils Relative: 0 %
Eosinophils Absolute: 0.2 10*3/uL (ref 0.0–0.7)
Eosinophils Relative: 4 %
HCT: 44.1 % (ref 39.0–52.0)
Hemoglobin: 14.9 g/dL (ref 13.0–17.0)
Lymphocytes Relative: 61 %
Lymphs Abs: 3.1 10*3/uL (ref 0.7–4.0)
MCH: 30.2 pg (ref 26.0–34.0)
MCHC: 33.8 g/dL (ref 30.0–36.0)
MCV: 89.5 fL (ref 78.0–100.0)
Monocytes Absolute: 0.4 10*3/uL (ref 0.1–1.0)
Monocytes Relative: 9 %
Neutro Abs: 1.3 10*3/uL — ABNORMAL LOW (ref 1.7–7.7)
Neutrophils Relative %: 26 %
Platelets: 184 10*3/uL (ref 150–400)
RBC: 4.93 MIL/uL (ref 4.22–5.81)
RDW: 13.4 % (ref 11.5–15.5)
WBC: 5 10*3/uL (ref 4.0–10.5)

## 2017-09-27 LAB — URINALYSIS, ROUTINE W REFLEX MICROSCOPIC
Bilirubin Urine: NEGATIVE
Glucose, UA: NEGATIVE mg/dL
Hgb urine dipstick: NEGATIVE
KETONES UR: NEGATIVE mg/dL
LEUKOCYTES UA: NEGATIVE
NITRITE: NEGATIVE
PH: 6 (ref 5.0–8.0)
PROTEIN: NEGATIVE mg/dL
Specific Gravity, Urine: 1.005 (ref 1.005–1.030)

## 2017-09-27 LAB — COMPREHENSIVE METABOLIC PANEL
ALT: 29 U/L (ref 17–63)
AST: 24 U/L (ref 15–41)
Albumin: 4.2 g/dL (ref 3.5–5.0)
Alkaline Phosphatase: 46 U/L (ref 38–126)
Anion gap: 10 (ref 5–15)
BUN: 9 mg/dL (ref 6–20)
CO2: 25 mmol/L (ref 22–32)
Calcium: 9.2 mg/dL (ref 8.9–10.3)
Chloride: 105 mmol/L (ref 101–111)
Creatinine, Ser: 1.22 mg/dL (ref 0.61–1.24)
GFR calc Af Amer: >60 mL/min (ref >60)
GFR calc non Af Amer: >60 mL/min (ref >60)
Glucose, Bld: 101 mg/dL — ABNORMAL HIGH (ref 65–99)
Potassium: 3.5 mmol/L (ref 3.5–5.1)
Sodium: 140 mmol/L (ref 135–145)
Total Bilirubin: 0.8 mg/dL (ref 0.3–1.2)
Total Protein: 6.8 g/dL (ref 6.5–8.1)

## 2017-09-27 LAB — LIPASE, BLOOD: Lipase: 61 U/L — ABNORMAL HIGH (ref 11–51)

## 2017-09-27 MED ORDER — PREDNISONE 10 MG (21) PO TBPK: ORAL_TABLET | Freq: Every day | ORAL | 0 refills | Status: DC

## 2017-09-27 NOTE — ED Notes (Signed)
Patient transported to CT 

## 2017-09-27 NOTE — ED Provider Notes (Signed)
[facility]   CSN: [id number] Arrival date & time: 09/27/17  0451     History   Chief Complaint Chief Complaint  Patient presents with  . Flank Pain    HPI Jacob Whitaker is a 62 y.o. male with history of DM, umbilical hernia repair, HLD, HTN presents with complaint of acute onset, intermittent right flank pain for 4 days.  He states pain is sharp initially 1 and is at its most severe but then eases up into a dull pain.  He states it occurs randomly.  Pain does not radiate.  He denies urinary symptoms, hematuria, melena, hematochezia, fevers, or chills.  Denies nausea or vomiting.  No chest pain or shortness of breath.  Pain does not radiate.  He states pain worsens when he lays on his right side.  He has tried 800 mg of ibuprofen for his symptoms with mild temporary relief.  He denies recent trauma or falls but does state he does a lot of bending at work.  He denies numbness, tingling, or weakness.  No bowel or bladder incontinence, no saddle anesthesia.  No IV drug use.  The history is provided by the patient.    Past Medical History:  Diagnosis Date  . Diabetes mellitus without complication (HCC)   . Full dentures   . Hernia, umbilical 03/2013  . Hyperlipidemia   . Hypertension   . Wears glasses     Patient Active Problem List   Diagnosis Date Noted  . Left flank pain 09/21/2013  . Umbilical hernia with obstruction 03/28/2013  . HTN (hypertension) 03/28/2013    Past Surgical History:  Procedure Laterality Date  . CARDIAC CATHETERIZATION  1990   cone-cp-  . ELBOW ARTHROSCOPY Left   . INSERTION OF MESH N/A 04/15/2013   Procedure: INSERTION OF MESH;  Surgeon: Shelly Rubensteinouglas A Blackman, MD;  Location: [facility];  Service: General;  Laterality: N/A;  . UMBILICAL HERNIA REPAIR N/A 04/15/2013   Procedure: HERNIA REPAIR UMBILICAL ADULT;  Surgeon: Shelly Rubensteinouglas A Blackman, MD;  Location: [facility];  Service:  General;  Laterality: N/A;       Home Medications    Prior to Admission medications   Medication Sig Start Date End Date Taking? Authorizing Provider  Guaifenesin (MUCINEX MAXIMUM STRENGTH) 1200 MG TB12 Take 1 tablet (1,200 mg total) by mouth every 12 (twelve) hours as needed. 10/21/15   Sherren MochaShaw, Eva N, MD  HYDROcodone-homatropine [facility](HYCODAN) 5-1.5 MG/5ML syrup Take 5 mLs by mouth every 6 (six) hours as needed for cough. 10/21/15   Sherren MochaShaw, Eva N, MD  ibuprofen (ADVIL,MOTRIN) 800 MG tablet Take 1 tablet (800 mg total) by mouth 3 (three) times daily. 09/21/13   Purvis SheffieldHarrison, Forrest, MD  lansoprazole (PREVACID) 15 MG capsule Take 15 mg by mouth daily. Reported on 10/10/2015    [provider]  losartan (COZAAR) 50 MG tablet  02/10/13   [provider]  oseltamivir (TAMIFLU) 75 MG capsule Take 1 capsule (75 mg total) by mouth 2 (two) times daily. 10/21/15   Sherren MochaShaw, Eva N, MD  predniSONE (STERAPRED UNI-PAK 21 TAB) 10 MG (21) TBPK tablet Take by mouth daily. Take 6 tabs by mouth daily  for 2 days, then 5 tabs for 2 days, then 4 tabs for 2 days, then 3 tabs for 2 days, 2 tabs for 2 days, then 1 tab by mouth daily for 2 days 09/27/17   Michela PitcherFawze, Jeromy Borcherding A, PA-C  sucralfate (CARAFATE) 1 GM/10ML  suspension Take 2 teaspoons up to 4 times a day on an empty stomach. 10/10/15   Cleta Alberts Maylon Peppers, MD    Family History Family History  Problem Relation Age of Onset  . Cancer Father        stomach    Social History Social History   Tobacco Use  . Smoking status: Never Smoker  . Smokeless tobacco: Never Used  Substance Use Topics  . Alcohol use: No  . Drug use: No     Allergies   Morphine and related and Oxycodone   Review of Systems Review of Systems  Constitutional: Negative for chills and fever.  Respiratory: Negative for shortness of breath.   Cardiovascular: Negative for chest pain.  Gastrointestinal: Negative for abdominal pain, nausea and vomiting.  Genitourinary: Positive for flank pain.    Musculoskeletal: Positive for back pain.  All other systems reviewed and are negative.    Physical Exam Updated Vital Signs BP 124/81   Pulse 69   Temp 97.8 F (36.6 C) (Oral)   Resp 16   Ht 5\' 6"  (1.676 m)   Wt 113.9 kg (251 lb)   SpO2 98%   BMI 40.51 kg/m   Physical Exam  Constitutional: He appears well-developed and well-nourished. No distress.  HENT:  Head: Normocephalic and atraumatic.  Eyes: Conjunctivae are normal. Right eye exhibits no discharge. Left eye exhibits no discharge.  Neck: Normal range of motion. Neck supple. No JVD present. No tracheal deviation present.  Cardiovascular: Normal rate, regular rhythm and normal heart sounds.  Pulmonary/Chest: Effort normal and breath sounds normal.  Abdominal: Soft. Bowel sounds are normal. He exhibits no distension and no mass. There is tenderness. There is no rebound and no guarding.  Mild tenderness to palpation in the right upper quadrant and epigastric region.  Murphy sign absent, Rovsing's absent, no tenderness to palpation McBurney's point.  No CVA tenderness.  Musculoskeletal: Normal range of motion. He exhibits tenderness. He exhibits no edema.  No midline spine TTP, right paralumbar muscle tenderness, no deformity, crepitus, or step-off noted.  5/5 strength of BLE major muscle groups.  Pain in the lumbar spine worsens mildly with flexion and extension of the lumbar spine.  Negative straight leg raise bilaterally  Neurological: He is alert. No sensory deficit.  Fluent speech, no facial droop, sensation intact to soft touch of bilateral lower extremities, normal gait, and patient able to heel walk and toe walk without difficulty.   Skin: Skin is warm and dry. No erythema.  Psychiatric: He has a normal mood and affect. His behavior is normal.  Nursing note and vitals reviewed.    ED Treatments / Results  Labs (all labs ordered are listed, but only abnormal results are displayed) Labs Reviewed  URINALYSIS, ROUTINE  W REFLEX MICROSCOPIC - Abnormal; Notable for the following components:      Result Value   Color, Urine STRAW (*)    All other components within normal limits  CBC WITH DIFFERENTIAL/PLATELET - Abnormal; Notable for the following components:   Neutro Abs 1.3 (*)    All other components within normal limits  COMPREHENSIVE METABOLIC PANEL - Abnormal; Notable for the following components:   Glucose, Bld 101 (*)    All other components within normal limits  LIPASE, BLOOD - Abnormal; Notable for the following components:   Lipase 61 (*)    All other components within normal limits    EKG  EKG Interpretation None       Radiology Ct Renal Stone  Study  Result Date: 09/27/2017 CLINICAL DATA:  Right flank pain beginning 3 days ago. EXAM: CT ABDOMEN AND PELVIS WITHOUT CONTRAST TECHNIQUE: Multidetector CT imaging of the abdomen and pelvis was performed following the standard protocol without IV contrast. COMPARISON:  09/21/2013 FINDINGS: Lower chest: There is cylindrical bronchiectasis in the lower lobes greater than right middle lobe and lingula which has increased from 2015. Mild atelectasis and scarring are noted in the lung bases. There is no pleural effusion. The heart is normal in size. Hepatobiliary: No focal liver abnormality is seen. No gallstones or biliary dilatation. Subtle nodular thickening at the gallbladder fundus, similar to the prior study and possibly reflecting focal adenomyomatosis. Pancreas: Unremarkable. Spleen: Unremarkable. Adrenals/Urinary Tract: Unremarkable adrenal glands. No evidence of renal mass, calculi, or hydronephrosis. No ureteral calculi or ureteral dilatation. Unremarkable bladder. Stomach/Bowel: The stomach is within normal limits. There is no evidence of bowel obstruction. Left-sided colonic diverticulosis is noted without evidence of diverticulitis. The appendix is unremarkable. Vascular/Lymphatic: Normal caliber of the abdominal aorta. No enlarged lymph nodes.  Reproductive: Mildly enlarged prostate. Other: No intraperitoneal free fluid.  No abdominal wall hernia. Musculoskeletal: Severe L4-5 facet arthrosis. IMPRESSION: 1. No acute abnormality identified in the abdomen or pelvis. No urinary tract calculi or hydronephrosis. 2. Increased bronchiectasis in both lung bases. Electronically Signed   By: Sebastian Ache M.D.   On: 09/27/2017 08:26    Procedures Procedures (including critical care time)  Medications Ordered in ED Medications - No data to display   Initial Impression / Assessment and Plan / ED Course  I have reviewed the triage vital signs and the nursing notes.  Pertinent labs & imaging results that were available during my care of the patient were reviewed by me and considered in my medical decision making (see chart for details).     Patient presents with right-sided low back pain reproducible on palpation for 4 days.  No known injury although he does do a lot of repetitive motion and bending at work.  He is afebrile, vital signs are stable.  Nontoxic in appearance.  No red flag signs concerning for cauda equina.  He has mild right upper quadrant and epigastric tenderness to palpation.  Lab work is reassuring with no leukocytosis or elevation in LFTs or creatinine.  No electrolyte abnormalities.  Lipase is very mildly elevated but in the absence of nausea, vomiting, or significant elevations I doubt pancreatitis.Weight is stopped more than this exam UA does not suggest UTI or nephrolithiasis.  CT of the abdomen shows no acute abdominal pathology.  It does show lumbar facet arthrosis.  On reevaluation, patient is resting comfortably tolerating p.o. food and fluids and repeat abdominal examination is benign.  I doubt obstruction, perforation, AAA, appendicitis, colitis, or other acute surgical abdominal pathology.  I suspect pain is likely musculoskeletal in nature suggestive of myofascial pain or lumbar strain.  Will discharge with prednisone  taper.  He is diabetic however his sugars are well controlled and he is compliant with his medications.  He also has follow-up scheduled with his primary care physician in the near future.  Recommend follow-up for his back pain and mildly elevated lipase for reevaluation.  Discussed indications for return to the ED.  Patient and patient's wife verbalized understanding of and agreement with plan and patient stable for discharge home at this time.  Final Clinical Impressions(s) / ED Diagnoses   Final diagnoses:  Acute right-sided low back pain without sciatica    ED Discharge Orders  Ordered    predniSONE (STERAPRED UNI-PAK 21 TAB) 10 MG (21) TBPK tablet  Daily     09/27/17 0932       Jeanie Sewer, PA-C 09/27/17 0865    Vanetta Mulders, MD 09/27/17 1645

## 2017-09-27 NOTE — Discharge Instructions (Signed)
1. Medications: Start taking prednisone as prescribed.  Do not take Advil, ibuprofen, Aleve, or Motrin on this medication.  You may take [phone] mg of Tylenol every 6 hours as needed for pain additionally.  When you are done taking the steroid, you may start taking 600 mg of ibuprofen every 6 hours or 800 mg of ibuprofen every 8 hours. 2. Treatment: rest, drink plenty of fluids, gentle stretching as discussed, alternate ice and heat 3. Follow Up: Please followup with your primary doctor in 3 days for discussion of your diagnoses and further evaluation after today's visit;  Return to the ER for worsening back pain, difficulty walking, loss of bowel or bladder control or other concerning symptoms

## 2017-09-27 NOTE — ED Triage Notes (Signed)
Pt arrives to Ed with c/o right flank pain since Thursday; Pt states pain is sharp at 7/10; pt a&ox 4 on arrival. Pt denies other sx;-Monique,RN

## 2017-10-18 DIAGNOSIS — J101 Influenza due to other identified influenza virus with other respiratory manifestations: Secondary | ICD-10-CM | POA: Diagnosis not present

## 2017-11-03 DIAGNOSIS — E1165 Type 2 diabetes mellitus with hyperglycemia: Secondary | ICD-10-CM | POA: Diagnosis not present

## 2017-11-03 DIAGNOSIS — E782 Mixed hyperlipidemia: Secondary | ICD-10-CM | POA: Diagnosis not present

## 2017-11-10 DIAGNOSIS — Z0000 Encounter for general adult medical examination without abnormal findings: Secondary | ICD-10-CM | POA: Diagnosis not present

## 2017-11-10 DIAGNOSIS — E1165 Type 2 diabetes mellitus with hyperglycemia: Secondary | ICD-10-CM | POA: Diagnosis not present

## 2017-11-10 DIAGNOSIS — M150 Primary generalized (osteo)arthritis: Secondary | ICD-10-CM | POA: Diagnosis not present

## 2017-11-12 DIAGNOSIS — H11159 Pinguecula, unspecified eye: Secondary | ICD-10-CM | POA: Diagnosis not present

## 2018-06-01 DIAGNOSIS — E1165 Type 2 diabetes mellitus with hyperglycemia: Secondary | ICD-10-CM | POA: Diagnosis not present

## 2018-06-01 DIAGNOSIS — I10 Essential (primary) hypertension: Secondary | ICD-10-CM | POA: Diagnosis not present

## 2018-06-01 DIAGNOSIS — E782 Mixed hyperlipidemia: Secondary | ICD-10-CM | POA: Diagnosis not present

## 2018-06-08 DIAGNOSIS — M545 Low back pain: Secondary | ICD-10-CM | POA: Diagnosis not present

## 2018-06-08 DIAGNOSIS — N182 Chronic kidney disease, stage 2 (mild): Secondary | ICD-10-CM | POA: Diagnosis not present

## 2018-06-08 DIAGNOSIS — E1165 Type 2 diabetes mellitus with hyperglycemia: Secondary | ICD-10-CM | POA: Diagnosis not present

## 2018-06-08 DIAGNOSIS — Z23 Encounter for immunization: Secondary | ICD-10-CM | POA: Diagnosis not present

## 2018-06-23 DIAGNOSIS — M6281 Muscle weakness (generalized): Secondary | ICD-10-CM | POA: Diagnosis not present

## 2018-06-23 DIAGNOSIS — M62830 Muscle spasm of back: Secondary | ICD-10-CM | POA: Diagnosis not present

## 2018-06-23 DIAGNOSIS — M545 Low back pain: Secondary | ICD-10-CM | POA: Diagnosis not present

## 2018-09-02 IMAGING — CT CT RENAL STONE PROTOCOL
2 of 4 series · 16 of 46 positions shown, 18 images · non-contrast
Comparison: 09/21/2013

CLINICAL DATA: Right flank pain beginning 3 days ago.

EXAM:
CT ABDOMEN AND PELVIS WITHOUT CONTRAST
TECHNIQUE: Multidetector CT imaging of the abdomen and pelvis was performed
following the standard protocol without IV contrast.

[Series 3: ap without · axial · non-contrast · 0.84mm/px · z∈[-485,-70]mm · 13 of 95 slices shown, 15 images]
[im 6/95  soft-tissue]
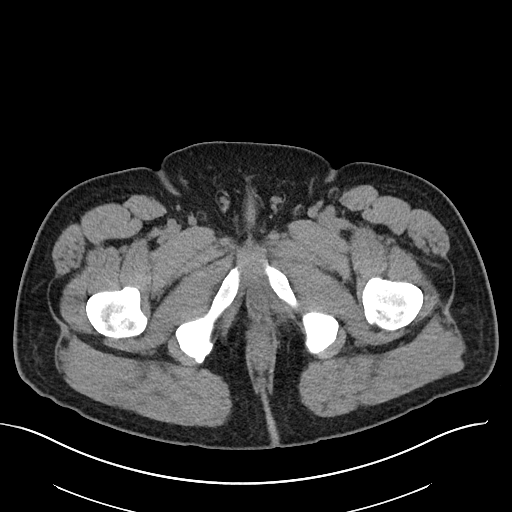
[im 6/95  bone]
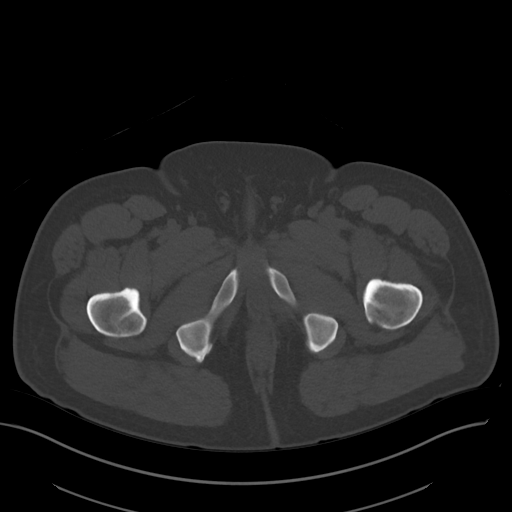
[im 11/95  soft-tissue]
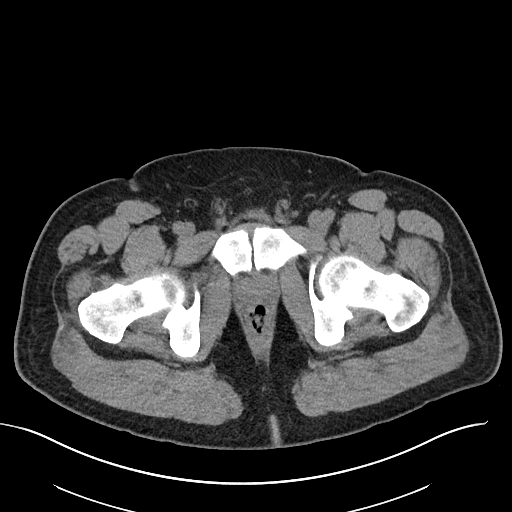
[im 21/95  soft-tissue]
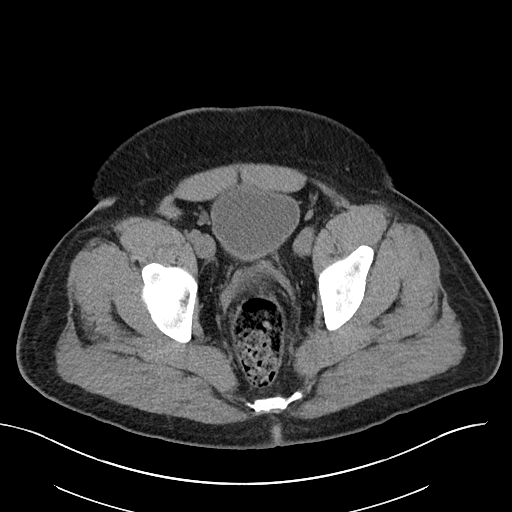
[im 27/95  soft-tissue]
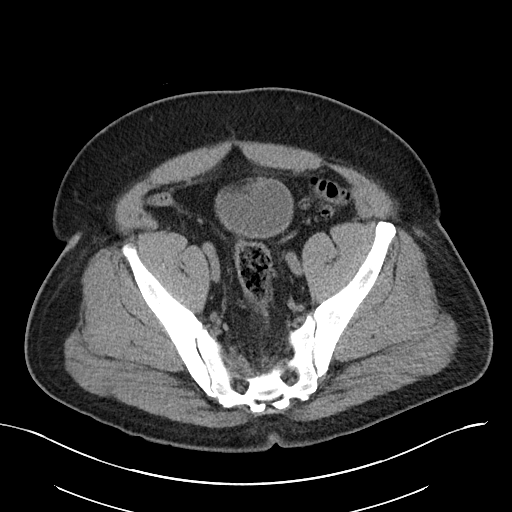
[im 32/95  soft-tissue]
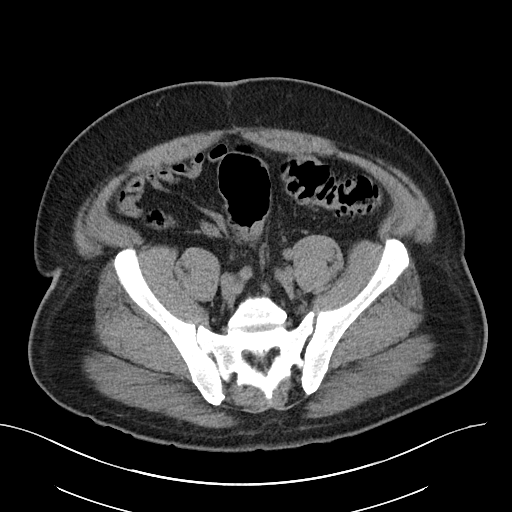
[im 42/95  soft-tissue]
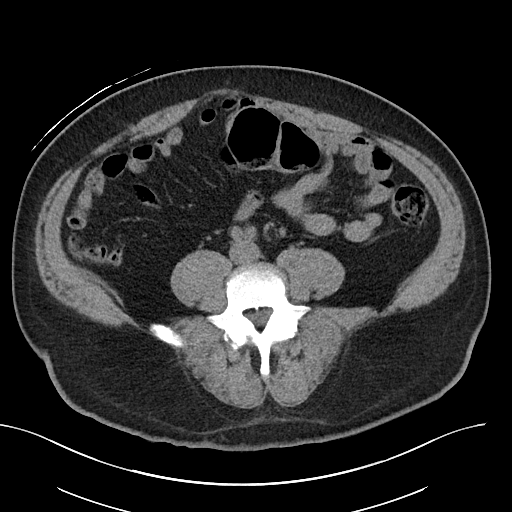
[im 48/95  soft-tissue]
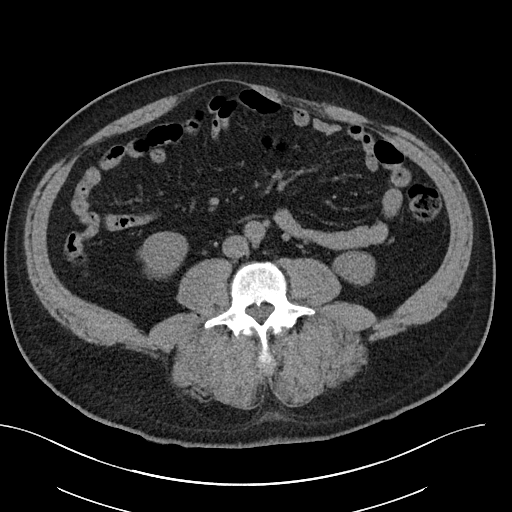
[im 53/95  soft-tissue]
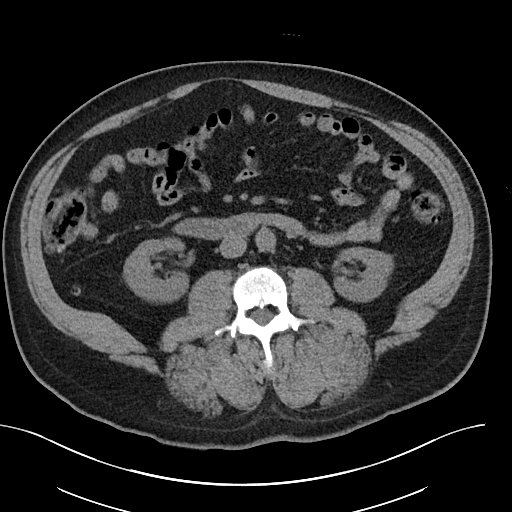
[im 63/95  soft-tissue]
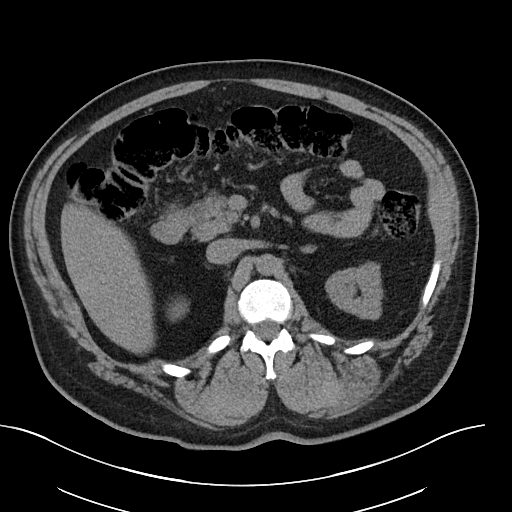
[im 63/95  bone]
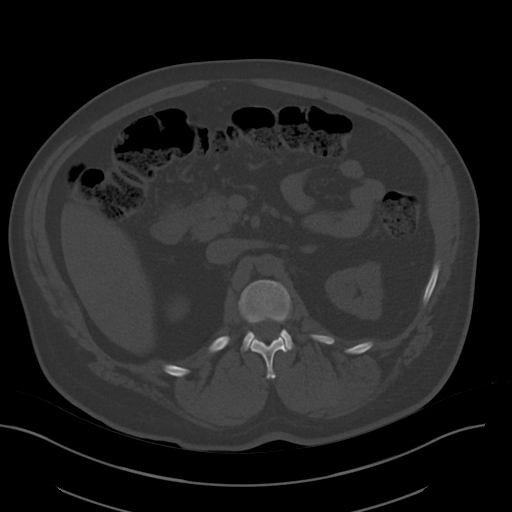
[im 68/95  soft-tissue]
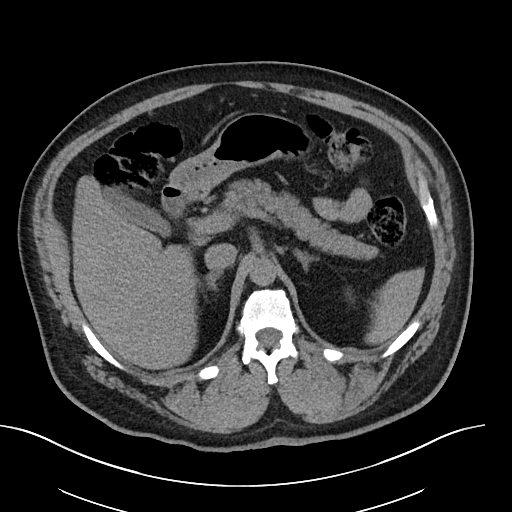
[im 74/95  soft-tissue]
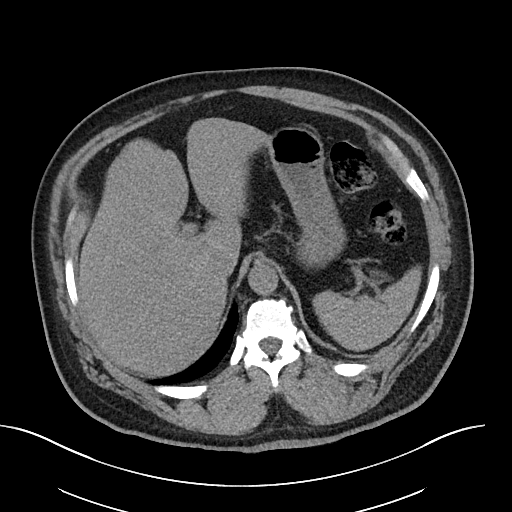
[im 84/95  soft-tissue]
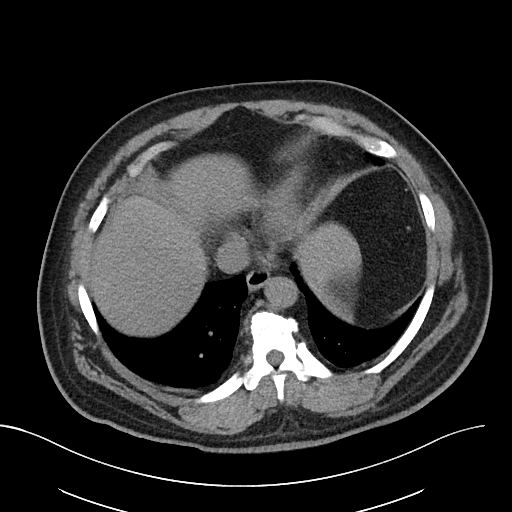
[im 89/95  soft-tissue]
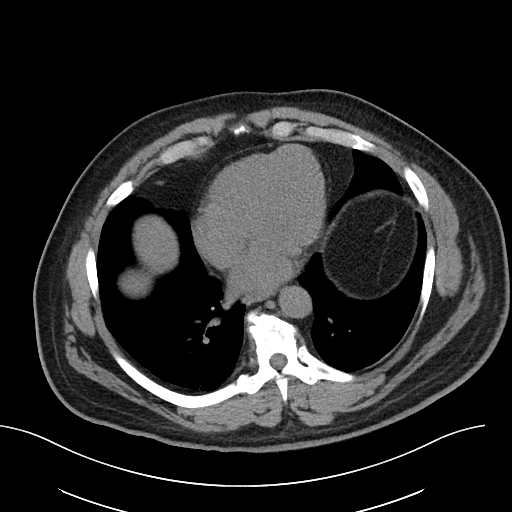

[Series 6: cor · coronal · 0.83mm/px · 3 of 106 slices shown]
[im 36/106  soft-tissue]
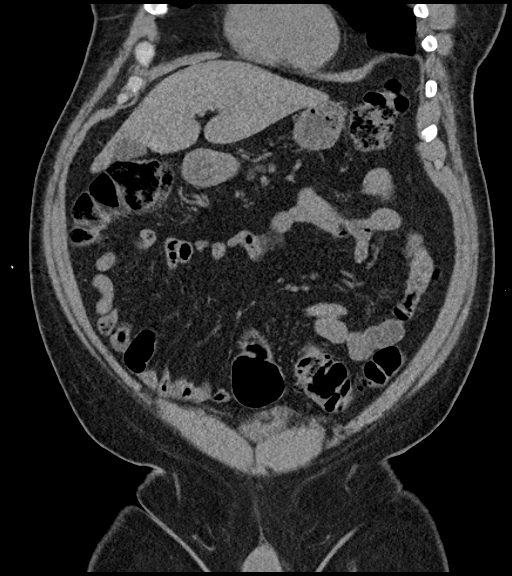
[im 47/106  soft-tissue]
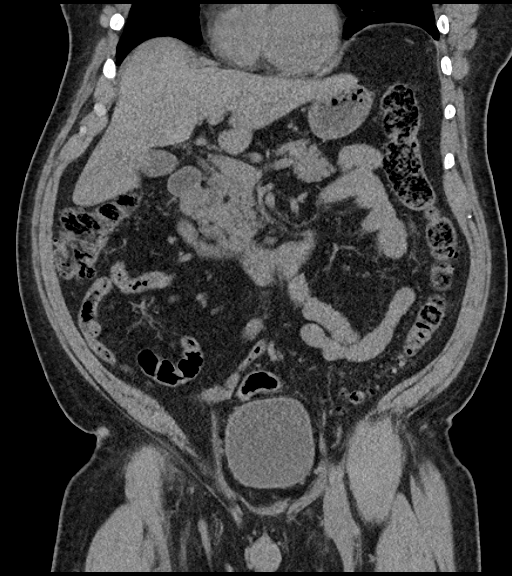
[im 59/106  soft-tissue]
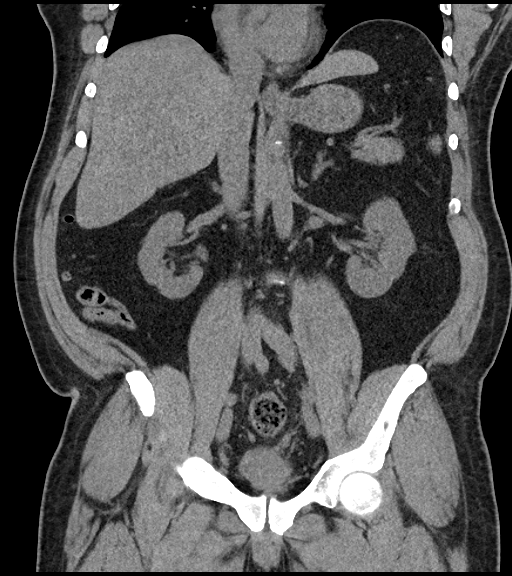

[16 of 46 positions shown; findings below may reference images not displayed]

FINDINGS: Lower chest: There is cylindrical bronchiectasis in the lower lobes
greater than right middle lobe and lingula which has increased from
4425. Mild atelectasis and scarring are noted in the lung bases.
There is no pleural effusion. The heart is normal in size.

Hepatobiliary: No focal liver abnormality is seen. No gallstones or
biliary dilatation. Subtle nodular thickening at the gallbladder
fundus, similar to the prior study and possibly reflecting focal
adenomyomatosis.

Pancreas: Unremarkable.

Spleen: Unremarkable.

Adrenals/Urinary Tract: Unremarkable adrenal glands. No evidence of
renal mass, calculi, or hydronephrosis. No ureteral calculi or
ureteral dilatation. Unremarkable bladder.

Stomach/Bowel: The stomach is within normal limits. There is no
evidence of bowel obstruction. Left-sided colonic diverticulosis is
noted without evidence of diverticulitis. The appendix is
unremarkable.

Vascular/Lymphatic: Normal caliber of the abdominal aorta. No
enlarged lymph nodes.

Reproductive: Mildly enlarged prostate.

Other: No intraperitoneal free fluid.  No abdominal wall hernia.

Musculoskeletal: Severe L4-5 facet arthrosis.
IMPRESSION: 1. No acute abnormality identified in the abdomen or pelvis. No
urinary tract calculi or hydronephrosis.
2. Increased bronchiectasis in both lung bases.

## 2020-10-15 DIAGNOSIS — N182 Chronic kidney disease, stage 2 (mild): Secondary | ICD-10-CM | POA: Diagnosis not present

## 2020-10-15 DIAGNOSIS — E782 Mixed hyperlipidemia: Secondary | ICD-10-CM | POA: Diagnosis not present

## 2020-10-15 DIAGNOSIS — I10 Essential (primary) hypertension: Secondary | ICD-10-CM | POA: Diagnosis not present

## 2020-10-15 DIAGNOSIS — K2100 Gastro-esophageal reflux disease with esophagitis, without bleeding: Secondary | ICD-10-CM | POA: Diagnosis not present

## 2020-10-15 DIAGNOSIS — E1165 Type 2 diabetes mellitus with hyperglycemia: Secondary | ICD-10-CM | POA: Diagnosis not present

## 2021-05-09 DIAGNOSIS — Z0000 Encounter for general adult medical examination without abnormal findings: Secondary | ICD-10-CM | POA: Diagnosis not present

## 2021-05-13 DIAGNOSIS — N182 Chronic kidney disease, stage 2 (mild): Secondary | ICD-10-CM | POA: Diagnosis not present

## 2021-05-13 DIAGNOSIS — E1165 Type 2 diabetes mellitus with hyperglycemia: Secondary | ICD-10-CM | POA: Diagnosis not present

## 2021-05-13 DIAGNOSIS — M150 Primary generalized (osteo)arthritis: Secondary | ICD-10-CM | POA: Diagnosis not present

## 2021-05-13 DIAGNOSIS — F5101 Primary insomnia: Secondary | ICD-10-CM | POA: Diagnosis not present

## 2021-05-13 DIAGNOSIS — G8929 Other chronic pain: Secondary | ICD-10-CM | POA: Diagnosis not present

## 2021-05-13 DIAGNOSIS — Z0000 Encounter for general adult medical examination without abnormal findings: Secondary | ICD-10-CM | POA: Diagnosis not present

## 2021-05-13 DIAGNOSIS — I10 Essential (primary) hypertension: Secondary | ICD-10-CM | POA: Diagnosis not present

## 2021-05-13 DIAGNOSIS — R197 Diarrhea, unspecified: Secondary | ICD-10-CM | POA: Diagnosis not present

## 2021-05-13 DIAGNOSIS — M25561 Pain in right knee: Secondary | ICD-10-CM | POA: Diagnosis not present

## 2021-05-13 DIAGNOSIS — Z23 Encounter for immunization: Secondary | ICD-10-CM | POA: Diagnosis not present

## 2021-05-28 DIAGNOSIS — M722 Plantar fascial fibromatosis: Secondary | ICD-10-CM | POA: Diagnosis not present

## 2021-09-09 DIAGNOSIS — E782 Mixed hyperlipidemia: Secondary | ICD-10-CM | POA: Diagnosis not present

## 2021-09-09 DIAGNOSIS — I10 Essential (primary) hypertension: Secondary | ICD-10-CM | POA: Diagnosis not present

## 2021-09-09 DIAGNOSIS — Z23 Encounter for immunization: Secondary | ICD-10-CM | POA: Diagnosis not present

## 2021-09-09 DIAGNOSIS — N182 Chronic kidney disease, stage 2 (mild): Secondary | ICD-10-CM | POA: Diagnosis not present

## 2021-09-09 DIAGNOSIS — E1165 Type 2 diabetes mellitus with hyperglycemia: Secondary | ICD-10-CM | POA: Diagnosis not present

## 2021-09-16 DIAGNOSIS — E1165 Type 2 diabetes mellitus with hyperglycemia: Secondary | ICD-10-CM | POA: Diagnosis not present

## 2021-09-16 DIAGNOSIS — I10 Essential (primary) hypertension: Secondary | ICD-10-CM | POA: Diagnosis not present

## 2021-09-16 DIAGNOSIS — E782 Mixed hyperlipidemia: Secondary | ICD-10-CM | POA: Diagnosis not present

## 2021-12-10 DIAGNOSIS — I10 Essential (primary) hypertension: Secondary | ICD-10-CM | POA: Diagnosis not present

## 2021-12-10 DIAGNOSIS — E1165 Type 2 diabetes mellitus with hyperglycemia: Secondary | ICD-10-CM | POA: Diagnosis not present

## 2021-12-10 DIAGNOSIS — E782 Mixed hyperlipidemia: Secondary | ICD-10-CM | POA: Diagnosis not present

## 2021-12-17 DIAGNOSIS — E782 Mixed hyperlipidemia: Secondary | ICD-10-CM | POA: Diagnosis not present

## 2021-12-17 DIAGNOSIS — I10 Essential (primary) hypertension: Secondary | ICD-10-CM | POA: Diagnosis not present

## 2021-12-17 DIAGNOSIS — N182 Chronic kidney disease, stage 2 (mild): Secondary | ICD-10-CM | POA: Diagnosis not present

## 2021-12-17 DIAGNOSIS — E1165 Type 2 diabetes mellitus with hyperglycemia: Secondary | ICD-10-CM | POA: Diagnosis not present

## 2022-03-25 DIAGNOSIS — E782 Mixed hyperlipidemia: Secondary | ICD-10-CM | POA: Diagnosis not present

## 2022-03-25 DIAGNOSIS — I10 Essential (primary) hypertension: Secondary | ICD-10-CM | POA: Diagnosis not present

## 2022-03-25 DIAGNOSIS — E1165 Type 2 diabetes mellitus with hyperglycemia: Secondary | ICD-10-CM | POA: Diagnosis not present

## 2022-04-01 DIAGNOSIS — I10 Essential (primary) hypertension: Secondary | ICD-10-CM | POA: Diagnosis not present

## 2022-04-01 DIAGNOSIS — N182 Chronic kidney disease, stage 2 (mild): Secondary | ICD-10-CM | POA: Diagnosis not present

## 2022-04-01 DIAGNOSIS — E782 Mixed hyperlipidemia: Secondary | ICD-10-CM | POA: Diagnosis not present

## 2022-04-01 DIAGNOSIS — D709 Neutropenia, unspecified: Secondary | ICD-10-CM | POA: Diagnosis not present

## 2022-04-01 DIAGNOSIS — E1165 Type 2 diabetes mellitus with hyperglycemia: Secondary | ICD-10-CM | POA: Diagnosis not present

## 2022-04-01 DIAGNOSIS — M150 Primary generalized (osteo)arthritis: Secondary | ICD-10-CM | POA: Diagnosis not present

## 2022-04-29 DIAGNOSIS — I10 Essential (primary) hypertension: Secondary | ICD-10-CM | POA: Diagnosis not present

## 2022-04-29 DIAGNOSIS — E782 Mixed hyperlipidemia: Secondary | ICD-10-CM | POA: Diagnosis not present

## 2022-04-29 DIAGNOSIS — N182 Chronic kidney disease, stage 2 (mild): Secondary | ICD-10-CM | POA: Diagnosis not present

## 2022-04-29 DIAGNOSIS — D709 Neutropenia, unspecified: Secondary | ICD-10-CM | POA: Diagnosis not present

## 2022-04-29 DIAGNOSIS — M150 Primary generalized (osteo)arthritis: Secondary | ICD-10-CM | POA: Diagnosis not present

## 2022-04-29 DIAGNOSIS — Z23 Encounter for immunization: Secondary | ICD-10-CM | POA: Diagnosis not present

## 2022-04-29 DIAGNOSIS — E1165 Type 2 diabetes mellitus with hyperglycemia: Secondary | ICD-10-CM | POA: Diagnosis not present

## 2022-05-09 DIAGNOSIS — Z23 Encounter for immunization: Secondary | ICD-10-CM | POA: Diagnosis not present

## 2022-06-30 DIAGNOSIS — E1165 Type 2 diabetes mellitus with hyperglycemia: Secondary | ICD-10-CM | POA: Diagnosis not present

## 2022-06-30 DIAGNOSIS — E782 Mixed hyperlipidemia: Secondary | ICD-10-CM | POA: Diagnosis not present

## 2022-06-30 DIAGNOSIS — R5383 Other fatigue: Secondary | ICD-10-CM | POA: Diagnosis not present

## 2022-07-07 DIAGNOSIS — D709 Neutropenia, unspecified: Secondary | ICD-10-CM | POA: Diagnosis not present

## 2022-07-07 DIAGNOSIS — N182 Chronic kidney disease, stage 2 (mild): Secondary | ICD-10-CM | POA: Diagnosis not present

## 2022-07-07 DIAGNOSIS — M150 Primary generalized (osteo)arthritis: Secondary | ICD-10-CM | POA: Diagnosis not present

## 2022-07-07 DIAGNOSIS — I10 Essential (primary) hypertension: Secondary | ICD-10-CM | POA: Diagnosis not present

## 2022-07-07 DIAGNOSIS — E782 Mixed hyperlipidemia: Secondary | ICD-10-CM | POA: Diagnosis not present

## 2022-07-07 DIAGNOSIS — F5101 Primary insomnia: Secondary | ICD-10-CM | POA: Diagnosis not present

## 2022-07-07 DIAGNOSIS — Z0000 Encounter for general adult medical examination without abnormal findings: Secondary | ICD-10-CM | POA: Diagnosis not present

## 2022-07-07 DIAGNOSIS — E1165 Type 2 diabetes mellitus with hyperglycemia: Secondary | ICD-10-CM | POA: Diagnosis not present

## 2022-11-03 DIAGNOSIS — E1165 Type 2 diabetes mellitus with hyperglycemia: Secondary | ICD-10-CM | POA: Diagnosis not present

## 2022-11-03 DIAGNOSIS — N182 Chronic kidney disease, stage 2 (mild): Secondary | ICD-10-CM | POA: Diagnosis not present

## 2022-11-03 DIAGNOSIS — I10 Essential (primary) hypertension: Secondary | ICD-10-CM | POA: Diagnosis not present

## 2022-11-03 DIAGNOSIS — E782 Mixed hyperlipidemia: Secondary | ICD-10-CM | POA: Diagnosis not present

## 2022-11-10 DIAGNOSIS — D709 Neutropenia, unspecified: Secondary | ICD-10-CM | POA: Diagnosis not present

## 2022-11-10 DIAGNOSIS — F5101 Primary insomnia: Secondary | ICD-10-CM | POA: Diagnosis not present

## 2022-11-10 DIAGNOSIS — E1165 Type 2 diabetes mellitus with hyperglycemia: Secondary | ICD-10-CM | POA: Diagnosis not present

## 2022-11-10 DIAGNOSIS — M150 Primary generalized (osteo)arthritis: Secondary | ICD-10-CM | POA: Diagnosis not present

## 2022-11-10 DIAGNOSIS — I10 Essential (primary) hypertension: Secondary | ICD-10-CM | POA: Diagnosis not present

## 2022-11-10 DIAGNOSIS — N182 Chronic kidney disease, stage 2 (mild): Secondary | ICD-10-CM | POA: Diagnosis not present

## 2022-11-10 DIAGNOSIS — E782 Mixed hyperlipidemia: Secondary | ICD-10-CM | POA: Diagnosis not present

## 2022-12-01 DIAGNOSIS — Z23 Encounter for immunization: Secondary | ICD-10-CM | POA: Diagnosis not present

## 2022-12-10 DIAGNOSIS — I10 Essential (primary) hypertension: Secondary | ICD-10-CM | POA: Diagnosis not present

## 2022-12-10 DIAGNOSIS — N1831 Chronic kidney disease, stage 3a: Secondary | ICD-10-CM | POA: Diagnosis not present

## 2022-12-10 DIAGNOSIS — E782 Mixed hyperlipidemia: Secondary | ICD-10-CM | POA: Diagnosis not present

## 2022-12-10 DIAGNOSIS — E1122 Type 2 diabetes mellitus with diabetic chronic kidney disease: Secondary | ICD-10-CM | POA: Diagnosis not present

## 2023-03-18 DIAGNOSIS — M21962 Unspecified acquired deformity of left lower leg: Secondary | ICD-10-CM | POA: Diagnosis not present

## 2023-03-18 DIAGNOSIS — B351 Tinea unguium: Secondary | ICD-10-CM | POA: Diagnosis not present

## 2023-03-18 DIAGNOSIS — E139 Other specified diabetes mellitus without complications: Secondary | ICD-10-CM | POA: Diagnosis not present

## 2023-03-18 DIAGNOSIS — G629 Polyneuropathy, unspecified: Secondary | ICD-10-CM | POA: Diagnosis not present

## 2023-03-18 DIAGNOSIS — I70203 Unspecified atherosclerosis of native arteries of extremities, bilateral legs: Secondary | ICD-10-CM | POA: Diagnosis not present

## 2023-03-25 DIAGNOSIS — G629 Polyneuropathy, unspecified: Secondary | ICD-10-CM | POA: Diagnosis not present

## 2023-03-25 DIAGNOSIS — E1142 Type 2 diabetes mellitus with diabetic polyneuropathy: Secondary | ICD-10-CM | POA: Diagnosis not present

## 2023-03-30 DIAGNOSIS — B351 Tinea unguium: Secondary | ICD-10-CM | POA: Diagnosis not present

## 2023-04-13 DIAGNOSIS — E782 Mixed hyperlipidemia: Secondary | ICD-10-CM | POA: Diagnosis not present

## 2023-04-13 DIAGNOSIS — D709 Neutropenia, unspecified: Secondary | ICD-10-CM | POA: Diagnosis not present

## 2023-04-13 DIAGNOSIS — E1165 Type 2 diabetes mellitus with hyperglycemia: Secondary | ICD-10-CM | POA: Diagnosis not present

## 2023-04-13 DIAGNOSIS — I10 Essential (primary) hypertension: Secondary | ICD-10-CM | POA: Diagnosis not present

## 2023-04-15 DIAGNOSIS — G629 Polyneuropathy, unspecified: Secondary | ICD-10-CM | POA: Diagnosis not present

## 2023-04-15 DIAGNOSIS — E1142 Type 2 diabetes mellitus with diabetic polyneuropathy: Secondary | ICD-10-CM | POA: Diagnosis not present

## 2023-04-17 DIAGNOSIS — G629 Polyneuropathy, unspecified: Secondary | ICD-10-CM | POA: Diagnosis not present

## 2023-04-17 DIAGNOSIS — E1122 Type 2 diabetes mellitus with diabetic chronic kidney disease: Secondary | ICD-10-CM | POA: Diagnosis not present

## 2023-04-20 DIAGNOSIS — Z23 Encounter for immunization: Secondary | ICD-10-CM | POA: Diagnosis not present

## 2023-04-20 DIAGNOSIS — N182 Chronic kidney disease, stage 2 (mild): Secondary | ICD-10-CM | POA: Diagnosis not present

## 2023-04-20 DIAGNOSIS — E782 Mixed hyperlipidemia: Secondary | ICD-10-CM | POA: Diagnosis not present

## 2023-04-20 DIAGNOSIS — E1142 Type 2 diabetes mellitus with diabetic polyneuropathy: Secondary | ICD-10-CM | POA: Diagnosis not present

## 2023-04-20 DIAGNOSIS — I10 Essential (primary) hypertension: Secondary | ICD-10-CM | POA: Diagnosis not present

## 2023-06-29 DIAGNOSIS — E1142 Type 2 diabetes mellitus with diabetic polyneuropathy: Secondary | ICD-10-CM | POA: Diagnosis not present

## 2023-06-29 DIAGNOSIS — N182 Chronic kidney disease, stage 2 (mild): Secondary | ICD-10-CM | POA: Diagnosis not present

## 2023-06-29 DIAGNOSIS — I10 Essential (primary) hypertension: Secondary | ICD-10-CM | POA: Diagnosis not present

## 2023-06-29 DIAGNOSIS — E782 Mixed hyperlipidemia: Secondary | ICD-10-CM | POA: Diagnosis not present

## 2023-07-08 DIAGNOSIS — R0789 Other chest pain: Secondary | ICD-10-CM | POA: Diagnosis not present

## 2023-07-08 DIAGNOSIS — K2100 Gastro-esophageal reflux disease with esophagitis, without bleeding: Secondary | ICD-10-CM | POA: Diagnosis not present

## 2023-07-13 DIAGNOSIS — E1122 Type 2 diabetes mellitus with diabetic chronic kidney disease: Secondary | ICD-10-CM | POA: Diagnosis not present

## 2023-07-13 DIAGNOSIS — N1831 Chronic kidney disease, stage 3a: Secondary | ICD-10-CM | POA: Diagnosis not present

## 2023-07-13 DIAGNOSIS — I10 Essential (primary) hypertension: Secondary | ICD-10-CM | POA: Diagnosis not present

## 2023-07-13 DIAGNOSIS — N182 Chronic kidney disease, stage 2 (mild): Secondary | ICD-10-CM | POA: Diagnosis not present

## 2023-07-13 DIAGNOSIS — G629 Polyneuropathy, unspecified: Secondary | ICD-10-CM | POA: Diagnosis not present

## 2023-07-13 DIAGNOSIS — E782 Mixed hyperlipidemia: Secondary | ICD-10-CM | POA: Diagnosis not present

## 2023-07-13 DIAGNOSIS — E1142 Type 2 diabetes mellitus with diabetic polyneuropathy: Secondary | ICD-10-CM | POA: Diagnosis not present

## 2023-08-17 DIAGNOSIS — Z0000 Encounter for general adult medical examination without abnormal findings: Secondary | ICD-10-CM | POA: Diagnosis not present

## 2023-08-17 DIAGNOSIS — R21 Rash and other nonspecific skin eruption: Secondary | ICD-10-CM | POA: Diagnosis not present

## 2023-08-24 DIAGNOSIS — F5101 Primary insomnia: Secondary | ICD-10-CM | POA: Diagnosis not present

## 2023-08-24 DIAGNOSIS — I10 Essential (primary) hypertension: Secondary | ICD-10-CM | POA: Diagnosis not present

## 2023-08-24 DIAGNOSIS — E782 Mixed hyperlipidemia: Secondary | ICD-10-CM | POA: Diagnosis not present

## 2023-08-24 DIAGNOSIS — E1165 Type 2 diabetes mellitus with hyperglycemia: Secondary | ICD-10-CM | POA: Diagnosis not present

## 2023-08-24 DIAGNOSIS — M150 Primary generalized (osteo)arthritis: Secondary | ICD-10-CM | POA: Diagnosis not present

## 2023-08-24 DIAGNOSIS — Z0000 Encounter for general adult medical examination without abnormal findings: Secondary | ICD-10-CM | POA: Diagnosis not present

## 2023-11-05 DIAGNOSIS — E1122 Type 2 diabetes mellitus with diabetic chronic kidney disease: Secondary | ICD-10-CM | POA: Diagnosis not present

## 2023-11-12 DIAGNOSIS — E1165 Type 2 diabetes mellitus with hyperglycemia: Secondary | ICD-10-CM | POA: Diagnosis not present

## 2023-11-12 DIAGNOSIS — E782 Mixed hyperlipidemia: Secondary | ICD-10-CM | POA: Diagnosis not present

## 2023-11-12 DIAGNOSIS — E1142 Type 2 diabetes mellitus with diabetic polyneuropathy: Secondary | ICD-10-CM | POA: Diagnosis not present

## 2023-11-12 DIAGNOSIS — I10 Essential (primary) hypertension: Secondary | ICD-10-CM | POA: Diagnosis not present

## 2023-11-12 DIAGNOSIS — E1122 Type 2 diabetes mellitus with diabetic chronic kidney disease: Secondary | ICD-10-CM | POA: Diagnosis not present

## 2023-11-12 DIAGNOSIS — N182 Chronic kidney disease, stage 2 (mild): Secondary | ICD-10-CM | POA: Diagnosis not present

## 2024-02-11 DIAGNOSIS — E782 Mixed hyperlipidemia: Secondary | ICD-10-CM | POA: Diagnosis not present

## 2024-02-11 DIAGNOSIS — N182 Chronic kidney disease, stage 2 (mild): Secondary | ICD-10-CM | POA: Diagnosis not present

## 2024-02-11 DIAGNOSIS — E1165 Type 2 diabetes mellitus with hyperglycemia: Secondary | ICD-10-CM | POA: Diagnosis not present

## 2024-02-11 DIAGNOSIS — I10 Essential (primary) hypertension: Secondary | ICD-10-CM | POA: Diagnosis not present

## 2024-02-18 DIAGNOSIS — I10 Essential (primary) hypertension: Secondary | ICD-10-CM | POA: Diagnosis not present

## 2024-02-18 DIAGNOSIS — E782 Mixed hyperlipidemia: Secondary | ICD-10-CM | POA: Diagnosis not present

## 2024-02-18 DIAGNOSIS — E1142 Type 2 diabetes mellitus with diabetic polyneuropathy: Secondary | ICD-10-CM | POA: Diagnosis not present

## 2024-02-18 DIAGNOSIS — G629 Polyneuropathy, unspecified: Secondary | ICD-10-CM | POA: Diagnosis not present

## 2024-02-18 DIAGNOSIS — N182 Chronic kidney disease, stage 2 (mild): Secondary | ICD-10-CM | POA: Diagnosis not present

## 2024-02-18 DIAGNOSIS — E1122 Type 2 diabetes mellitus with diabetic chronic kidney disease: Secondary | ICD-10-CM | POA: Diagnosis not present

## 2024-02-18 DIAGNOSIS — N1831 Chronic kidney disease, stage 3a: Secondary | ICD-10-CM | POA: Diagnosis not present

## 2024-03-15 DIAGNOSIS — I10 Essential (primary) hypertension: Secondary | ICD-10-CM | POA: Diagnosis not present

## 2024-03-15 DIAGNOSIS — N182 Chronic kidney disease, stage 2 (mild): Secondary | ICD-10-CM | POA: Diagnosis not present

## 2024-03-15 DIAGNOSIS — E1165 Type 2 diabetes mellitus with hyperglycemia: Secondary | ICD-10-CM | POA: Diagnosis not present

## 2024-03-15 DIAGNOSIS — E782 Mixed hyperlipidemia: Secondary | ICD-10-CM | POA: Diagnosis not present

## 2024-03-22 DIAGNOSIS — E782 Mixed hyperlipidemia: Secondary | ICD-10-CM | POA: Diagnosis not present

## 2024-03-22 DIAGNOSIS — M150 Primary generalized (osteo)arthritis: Secondary | ICD-10-CM | POA: Diagnosis not present

## 2024-03-22 DIAGNOSIS — E1165 Type 2 diabetes mellitus with hyperglycemia: Secondary | ICD-10-CM | POA: Diagnosis not present

## 2024-03-22 DIAGNOSIS — N182 Chronic kidney disease, stage 2 (mild): Secondary | ICD-10-CM | POA: Diagnosis not present

## 2024-03-22 DIAGNOSIS — R002 Palpitations: Secondary | ICD-10-CM | POA: Diagnosis not present

## 2024-03-22 DIAGNOSIS — D709 Neutropenia, unspecified: Secondary | ICD-10-CM | POA: Diagnosis not present

## 2024-03-22 DIAGNOSIS — F5101 Primary insomnia: Secondary | ICD-10-CM | POA: Diagnosis not present

## 2024-03-22 DIAGNOSIS — R42 Dizziness and giddiness: Secondary | ICD-10-CM | POA: Diagnosis not present

## 2024-03-22 DIAGNOSIS — I10 Essential (primary) hypertension: Secondary | ICD-10-CM | POA: Diagnosis not present

## 2024-04-02 ENCOUNTER — Other Ambulatory Visit: Payer: Self-pay

## 2024-04-02 ENCOUNTER — Emergency Department (HOSPITAL_COMMUNITY)
Admission: EM | Admit: 2024-04-02 | Discharge: 2024-04-02 | Disposition: A | Discharge status: Home / Self Care | Attending: Emergency Medicine | Admitting: Emergency Medicine

## 2024-04-02 ENCOUNTER — Encounter (HOSPITAL_COMMUNITY): Payer: Self-pay

## 2024-04-02 ENCOUNTER — Emergency Department (HOSPITAL_COMMUNITY)

## 2024-04-02 DIAGNOSIS — Z79899 Other long term (current) drug therapy: Secondary | ICD-10-CM | POA: Diagnosis not present

## 2024-04-02 DIAGNOSIS — R918 Other nonspecific abnormal finding of lung field: Secondary | ICD-10-CM | POA: Diagnosis not present

## 2024-04-02 DIAGNOSIS — I471 Supraventricular tachycardia, unspecified: Secondary | ICD-10-CM | POA: Diagnosis not present

## 2024-04-02 DIAGNOSIS — R0789 Other chest pain: Secondary | ICD-10-CM | POA: Diagnosis not present

## 2024-04-02 DIAGNOSIS — E876 Hypokalemia: Secondary | ICD-10-CM | POA: Insufficient documentation

## 2024-04-02 DIAGNOSIS — I10 Essential (primary) hypertension: Secondary | ICD-10-CM | POA: Diagnosis not present

## 2024-04-02 DIAGNOSIS — R0989 Other specified symptoms and signs involving the circulatory and respiratory systems: Secondary | ICD-10-CM | POA: Diagnosis not present

## 2024-04-02 DIAGNOSIS — E119 Type 2 diabetes mellitus without complications: Secondary | ICD-10-CM | POA: Diagnosis not present

## 2024-04-02 DIAGNOSIS — R002 Palpitations: Secondary | ICD-10-CM | POA: Diagnosis not present

## 2024-04-02 LAB — BASIC METABOLIC PANEL WITH GFR
Anion gap: 12 (ref 5–15)
BUN: 8 mg/dL (ref 8–23)
CO2: 23 mmol/L (ref 22–32)
Calcium: 9.6 mg/dL (ref 8.9–10.3)
Chloride: 105 mmol/L (ref 98–111)
Creatinine, Ser: 1.26 mg/dL — ABNORMAL HIGH (ref 0.61–1.24)
GFR, Estimated: >60 mL/min (ref >60)
Glucose, Bld: 147 mg/dL — ABNORMAL HIGH (ref 70–99)
Potassium: 2.9 mmol/L — ABNORMAL LOW (ref 3.5–5.1)
Sodium: 140 mmol/L (ref 135–145)

## 2024-04-02 LAB — CBC
HCT: 44.3 % (ref 39.0–52.0)
Hemoglobin: 15.2 g/dL (ref 13.0–17.0)
MCH: 30 pg (ref 26.0–34.0)
MCHC: 34.3 g/dL (ref 30.0–36.0)
MCV: 87.4 fL (ref 80.0–100.0)
Platelets: 223 K/uL (ref 150–400)
RBC: 5.07 MIL/uL (ref 4.22–5.81)
RDW: 13 % (ref 11.5–15.5)
WBC: 6.6 K/uL (ref 4.0–10.5)
nRBC: 0 % (ref 0.0–0.2)

## 2024-04-02 LAB — I-STAT CHEM 8, ED
BUN: 10 mg/dL (ref 8–23)
Calcium, Ion: 1.15 mmol/L (ref 1.15–1.40)
Chloride: 104 mmol/L (ref 98–111)
Creatinine, Ser: 1.4 mg/dL — ABNORMAL HIGH (ref 0.61–1.24)
Glucose, Bld: 145 mg/dL — ABNORMAL HIGH (ref 70–99)
HCT: 43 % (ref 39.0–52.0)
Hemoglobin: 14.6 g/dL (ref 13.0–17.0)
Potassium: 3.1 mmol/L — ABNORMAL LOW (ref 3.5–5.1)
Sodium: 142 mmol/L (ref 135–145)
TCO2: 25 mmol/L (ref 22–32)

## 2024-04-02 LAB — TROPONIN I (HIGH SENSITIVITY)
Troponin I (High Sensitivity): 9 ng/L (ref <18)
Troponin I (High Sensitivity): 11 ng/L (ref <18)

## 2024-04-02 LAB — MAGNESIUM: Magnesium: 1.7 mg/dL (ref 1.7–2.4)

## 2024-04-02 MED ORDER — METOPROLOL TARTRATE 25 MG PO TABS: 25.0000 mg | ORAL_TABLET | Freq: Every day | ORAL | 0 refills | Status: DC | PRN

## 2024-04-02 MED ORDER — POTASSIUM CHLORIDE CRYS ER 20 MEQ PO TBCR
40.0000 meq | EXTENDED_RELEASE_TABLET | Freq: Once | ORAL | Status: AC
  Administered 2024-04-02: 40 meq via ORAL
  Filled 2024-04-02: qty 2

## 2024-04-02 MED ORDER — SODIUM CHLORIDE 0.9 % IV BOLUS
1000.0000 mL | Freq: Once | INTRAVENOUS | Status: AC
  Administered 2024-04-02: 1000 mL via INTRAVENOUS

## 2024-04-02 MED ORDER — METOPROLOL TARTRATE 25 MG PO TABS: 25.0000 mg | ORAL_TABLET | Freq: Every day | ORAL | 0 refills | Status: DC

## 2024-04-02 MED ORDER — METOPROLOL TARTRATE 25 MG PO TABS
25.0000 mg | ORAL_TABLET | Freq: Once | ORAL | Status: AC
  Administered 2024-04-02: 25 mg via ORAL
  Filled 2024-04-02: qty 1

## 2024-04-02 NOTE — ED Provider Notes (Signed)
 [place] EMERGENCY DEPARTMENT AT [facility] Provider Note   CSN: [id number] Arrival date & time: 04/02/24  1731     Patient presents with: Chest Pain and Palpitations   SPIRO AUSBORN is a 68 y.o. male.   Patient is a 68 year old male with past medical history of hypertension, hyperlipidemia, and diabetes presenting for lightheadedness, dizziness, and palpitations.  Patient presented in SVT.  It resolved spontaneously.  He states that he had these episodes back-to-back prior to calling EMS.  He states he is also had approximately 2-3 of these episodes a week for the past month.  His PCP ordered him a Holter monitor.  The history is provided by the patient. No language interpreter was used.  Chest Pain Associated symptoms: dizziness, palpitations and weakness   Associated symptoms: no abdominal pain, no back pain, no cough, no fever, no shortness of breath and no vomiting   Palpitations Associated symptoms: chest pain, dizziness and weakness   Associated symptoms: no back pain, no cough, no shortness of breath and no vomiting        Prior to Admission medications   Medication Sig Start Date End Date Taking? Authorizing Provider  Guaifenesin  (MUCINEX  MAXIMUM STRENGTH) 1200 MG TB12 Take 1 tablet (1,200 mg total) by mouth every 12 (twelve) hours as needed. 10/21/15   Loreli Elyn SAILOR, MD  HYDROcodone -homatropine (HYCODAN) 5-1.5 MG/5ML syrup Take 5 mLs by mouth every 6 (six) hours as needed for cough. 10/21/15   Loreli Elyn SAILOR, MD  ibuprofen  (ADVIL ,MOTRIN ) 800 MG tablet Take 1 tablet (800 mg total) by mouth 3 (three) times daily. 09/21/13   Margrette Pear, MD  lansoprazole (PREVACID) 15 MG capsule Take 15 mg by mouth daily. Reported on 10/10/2015    [provider]  losartan (COZAAR) 50 MG tablet  02/10/13   [provider]  metoprolol  tartrate (LOPRESSOR ) 25 MG tablet Take 1 tablet (25 mg total) by mouth daily as needed (palpitations, fast heart rate). 04/02/24   Elnor Bernarda SQUIBB, DO  oseltamivir  (TAMIFLU ) 75 MG capsule Take 1 capsule (75 mg total) by mouth 2 (two) times daily. 10/21/15   Loreli Elyn SAILOR, MD  predniSONE  (STERAPRED UNI-PAK 21 TAB) 10 MG (21) TBPK tablet Take by mouth daily. Take 6 tabs by mouth daily  for 2 days, then 5 tabs for 2 days, then 4 tabs for 2 days, then 3 tabs for 2 days, 2 tabs for 2 days, then 1 tab by mouth daily for 2 days 09/27/17   Fawze, Mina A, PA-C  sucralfate  (CARAFATE ) 1 GM/10ML suspension Take 2 teaspoons up to 4 times a day on an empty stomach. 10/10/15   Humberto Elspeth LABOR, MD    Allergies: Morphine  and codeine and Oxycodone     Review of Systems  Constitutional:  Negative for chills and fever.  HENT:  Negative for ear pain and sore throat.   Eyes:  Negative for pain and visual disturbance.  Respiratory:  Negative for cough and shortness of breath.   Cardiovascular:  Positive for chest pain and palpitations.  Gastrointestinal:  Negative for abdominal pain and vomiting.  Genitourinary:  Negative for dysuria and hematuria.  Musculoskeletal:  Negative for arthralgias and back pain.  Skin:  Negative for color change and rash.  Neurological:  Positive for dizziness, weakness and light-headedness. Negative for seizures and syncope.  All other systems reviewed and are negative.   Updated Vital Signs BP (!) 132/106   Pulse 93   Temp 98.4 F (36.9 C) (  Oral)   Resp 18   Ht 5' 6 (1.676 m)   Wt 110.7 kg   SpO2 99%   BMI 39.38 kg/m   Physical Exam Vitals and nursing note reviewed.  Constitutional:      General: He is not in acute distress.    Appearance: He is well-developed.  HENT:     Head: Normocephalic and atraumatic.  Eyes:     Conjunctiva/sclera: Conjunctivae normal.  Cardiovascular:     Rate and Rhythm: Normal rate and regular rhythm.     Heart sounds: No murmur heard. Pulmonary:     Effort: Pulmonary effort is normal. No respiratory distress.     Breath sounds: Normal breath sounds.  Abdominal:      Palpations: Abdomen is soft.     Tenderness: There is no abdominal tenderness.  Musculoskeletal:        General: No swelling.     Cervical back: Neck supple.  Skin:    General: Skin is warm and dry.     Capillary Refill: Capillary refill takes less than 2 seconds.  Neurological:     Mental Status: He is alert.  Psychiatric:        Mood and Affect: Mood normal.     (all labs ordered are listed, but only abnormal results are displayed) Labs Reviewed  BASIC METABOLIC PANEL WITH GFR - Abnormal; Notable for the following components:      Result Value   Potassium 2.9 (*)    Glucose, Bld 147 (*)    Creatinine, Ser 1.26 (*)    All other components within normal limits  I-STAT CHEM 8, ED - Abnormal; Notable for the following components:   Potassium 3.1 (*)    Creatinine, Ser 1.40 (*)    Glucose, Bld 145 (*)    All other components within normal limits  CBC  MAGNESIUM  TROPONIN I (HIGH SENSITIVITY)  TROPONIN I (HIGH SENSITIVITY)    EKG: EKG Interpretation Date/Time:  Saturday April 02 2024 17:53:05 EDT Ventricular Rate:  174 PR Interval:  188 QRS Duration:  74 QT Interval:  305 QTC Calculation: 519 R Axis:   35  Text Interpretation: Supraventricular tachycardia Probable posterior infarct, recent Lateral leads are also involved Confirmed by Elnor Hila ([id number]) on 04/02/2024 6:26:42 PM  Radiology: No results found.   .Critical Care  Performed by: Elnor Hila SQUIBB, DO Authorized by: Elnor Hila SQUIBB, DO   Critical care provider statement:    Critical care time (minutes):  30   Critical care was necessary to treat or prevent imminent or life-threatening deterioration of the following conditions:  Cardiac failure   Critical care was time spent personally by me on the following activities:  Development of treatment plan with patient or surrogate, discussions with consultants, evaluation of patient's response to treatment, examination of patient, ordering and review of  laboratory studies, ordering and review of radiographic studies, ordering and performing treatments and interventions, pulse oximetry, re-evaluation of patient's condition and review of old charts   Care discussed with comment:  Cardiology    Medications Ordered in the ED  sodium chloride  0.9 % bolus 1,000 mL (0 mLs Intravenous Stopped 04/02/24 2023)  potassium chloride  SA (KLOR-CON  M) CR tablet 40 mEq (40 mEq Oral Given 04/02/24 1854)  metoprolol  tartrate (LOPRESSOR ) tablet 25 mg (25 mg Oral Given 04/02/24 2027)  Medical Decision Making Amount and/or Complexity of Data Reviewed Labs: ordered. Radiology: ordered.  Risk Prescription drug management.   68 year old male with hypertension, hyperlipidemia, and diabetes presenting in SVT that spontaneously resolved.  He is states he is had multiple of these episodes throughout the week.  He is tachycardic currently at 108 and otherwise sinus rhythm.  IV fluids given.  Hypokalemia present.  Potassium replaced.  Patient was on cardiac monitor for approximately 2 hours with no repeat episodes.  Twelve-lead EKG demonstrates no ischemic activity.  Troponins are stable.  Chest x-ray stable.  No repeat episodes of SVT while in ED.  Cardiology recommending PRN metoprolol  ordered for home.  Metoprolol  given before discharge.  [facility] follow-up recommended in the next 2 to 4 weeks.  Patient in no distress and overall condition improved here in the ED. Detailed discussions were had with the patient regarding current findings, and need for close f/u with PCP or on call doctor. The patient has been instructed to return immediately if the symptoms worsen in any way for re-evaluation. Patient verbalized understanding and is in agreement with current care plan. All questions answered prior to discharge.      Final diagnoses:  SVT (supraventricular tachycardia) [facility])    ED Discharge Orders          Ordered     metoprolol  tartrate (LOPRESSOR ) 25 MG tablet  Daily,   Status:  Discontinued        04/02/24 2011    metoprolol  tartrate (LOPRESSOR ) 25 MG tablet  Daily PRN        04/02/24 2156               Elnor Bernarda SQUIBB, DO 04/11/24 [id number]

## 2024-04-02 NOTE — ED Notes (Signed)
 ED Provider at bedside.

## 2024-04-02 NOTE — ED Triage Notes (Signed)
 Here by POV from home, wife parking car, here for fast HR and chest heaviness. Onset 1.5 hrs ago. Alert, NAD, calm, interactive, resps e/u, speaking in clear complete sentences. Wife at side.

## 2024-04-27 DIAGNOSIS — R002 Palpitations: Secondary | ICD-10-CM | POA: Diagnosis not present

## 2024-04-28 DIAGNOSIS — Z23 Encounter for immunization: Secondary | ICD-10-CM | POA: Diagnosis not present

## 2024-04-28 DIAGNOSIS — I471 Supraventricular tachycardia, unspecified: Secondary | ICD-10-CM | POA: Diagnosis not present

## 2024-04-28 DIAGNOSIS — R002 Palpitations: Secondary | ICD-10-CM | POA: Diagnosis not present

## 2024-04-29 LAB — LAB REPORT - SCANNED
A1c: 6.9
Albumin, Urine POC: 100.4
Creatinine, POC: 388.7 mg/dL
EGFR: 60
Microalb Creat Ratio: 26

## 2024-05-11 NOTE — Progress Notes (Signed)
 Cardiology Office Note:    Date:  05/25/2024   ID:  Jacob Whitaker, DOB October 08, 1955, MRN [id number]  PCP:  Jacob Lombard, MD   [facility] Providers Cardiologist:  None     Referring MD: Jacob Lombard, MD   Chief Complaint  Patient presents with   SVT    History of Present Illness:    Jacob Whitaker is a 68 y.o. male is seen at the request of Dr Jacob for evaluation of SVT. He has a history of DM, HTN, HLD. He was seen in the ED in September with symptoms of tachycardia and lightheadedness. He had SVT with rate of 174 - this converted spontaneously. Noted several episodes in the preceding month. Was given metoprolol  to take PRN. Later took metoprolol  tartrate 25 mg daily.   He reports tachycardia for 2 years. Some associated chest pressure. No dizziness or syncope. Notes symptoms less pronounced since on metoprolol . He did wear an event monitor in September showing multiple episodes of SVT. Report scanned into record. He had remote Myoview in 2010 that was normal. States he had a cardiac cath at age 61- presumably ok. He drinks one cup of coffee daily.   Past Medical History:  Diagnosis Date   Diabetes mellitus without complication (HCC)    Full dentures    Hernia, umbilical 03/2013   Hyperlipidemia    Hypertension    Polyneuropathy    Wears glasses     Past Surgical History:  Procedure Laterality Date   CARDIAC CATHETERIZATION  1990   cone-cp-   ELBOW ARTHROSCOPY Left    INSERTION OF MESH N/A 04/15/2013   Procedure: INSERTION OF MESH;  Surgeon: Jacob DELENA Poli, MD;  Location: [facility];  Service: General;  Laterality: N/A;   UMBILICAL HERNIA REPAIR N/A 04/15/2013   Procedure: HERNIA REPAIR UMBILICAL ADULT;  Surgeon: Jacob DELENA Poli, MD;  Location: [facility];  Service: General;  Laterality: N/A;    Current Medications: Current Meds  Medication Sig   atorvastatin (LIPITOR) 20 MG tablet Take 20 mg by mouth  daily.   Continuous Glucose Sensor (FREESTYLE LIBRE 3 PLUS SENSOR) MISC    esomeprazole (NEXIUM) 40 MG capsule Take 40 mg by mouth daily.   gabapentin (NEURONTIN) 100 MG capsule Take by mouth.   insulin degludec (TRESIBA FLEXTOUCH) 100 UNIT/ML FlexTouch Pen    losartan (COZAAR) 50 MG tablet    metFORMIN (GLUCOPHAGE-XR) 500 MG 24 hr tablet SMARTSIG:2 Tablet(s) By Mouth Every Evening   metoprolol  succinate (TOPROL -XL) 50 MG 24 hr tablet Take 1 tablet (50 mg total) by mouth daily. Take with or immediately following a meal.   Semaglutide,0.25 or 0.5MG /DOS, (OZEMPIC, 0.25 OR 0.5 MG/DOSE,) 2 MG/3ML SOPN    [DISCONTINUED] chlorthalidone  (HYGROTON ) 25 MG tablet Take 25 mg by mouth every morning.   [DISCONTINUED] metoprolol  tartrate (LOPRESSOR ) 25 MG tablet Take 1 tablet (25 mg total) by mouth daily as needed (palpitations, fast heart rate).   Current Facility-Administered Medications for the 05/25/24 encounter (Office Visit) with Jacob Dineen M, MD  Medication   albuterol  (PROVENTIL ) (2.5 MG/3ML) 0.083% nebulizer solution 2.5 mg   ipratropium (ATROVENT ) nebulizer solution 0.5 mg     Allergies:   Morphine  and codeine and Oxycodone    Social History   Socioeconomic History   Marital status: Married    Spouse name: Not on file   Number of children: 1   Years of education: Not on file   Highest education level: Not on file  Occupational History   Not on file  Tobacco Use   Smoking status: Never   Smokeless tobacco: Never  Substance and Sexual Activity   Alcohol use: No   Drug use: No   Sexual activity: Not on file  Other Topics Concern   Not on file  Social History Narrative   Retired estate manager/land agent work   Social Drivers of Corporate Investment Banker Strain: Not on Ship Broker Insecurity: Not on file  Transportation Needs: Not on file  Physical Activity: Not on file  Stress: Not on file  Social Connections: Not on file     Family History: The patient's family history includes  Cancer in his father.  ROS:   Please see the history of present illness.     All other systems reviewed and are negative.  EKGs/Labs/Other Studies Reviewed:    The following studies were reviewed today: Tracings from ED in September personally reviewed.       Recent Labs: 04/02/2024: BUN 10; Creatinine, Ser 1.40; Hemoglobin 14.6; Magnesium 1.7; Platelets 223; Potassium 3.1; Sodium 142  Recent Lipid Panel    Component Value Date/Time   CHOL (H) 04/15/2009 0449    219        ATP III CLASSIFICATION:  <200     mg/dL   Desirable  799-760  mg/dL   Borderline High  >=759    mg/dL   High          TRIG 742 (H) 04/15/2009 0449   HDL 40 04/15/2009 0449   CHOLHDL 5.5 04/15/2009 0449   VLDL 51 (H) 04/15/2009 0449   LDLCALC (H) 04/15/2009 0449    128        Total Cholesterol/HDL:CHD Risk Coronary Heart Disease Risk Table                     Men   Women  1/2 Average Risk   3.4   3.3  Average Risk       5.0   4.4  2 X Average Risk   9.6   7.1  3 X Average Risk  23.4   11.0        Use the calculated Patient Ratio above and the CHD Risk Table to determine the patient's CHD Risk.        ATP III CLASSIFICATION (LDL):  <100     mg/dL   Optimal  899-870  mg/dL   Near or Above                    Optimal  130-159  mg/dL   Borderline  839-810  mg/dL   High  >809     mg/dL   Very High     Risk Assessment/Calculations:                Physical Exam:    VS:  BP 110/70   Pulse 83   Ht 5' 6 (1.676 Whitaker)   Wt 244 lb (110.7 kg)   SpO2 98%   BMI 39.38 kg/Whitaker     Wt Readings from Last 3 Encounters:  05/25/24 244 lb (110.7 kg)  04/02/24 244 lb (110.7 kg)  09/27/17 251 lb (113.9 kg)     GEN:  Well nourished, obese in no acute distress HEENT: Normal NECK: No JVD; No carotid bruits LYMPHATICS: No lymphadenopathy CARDIAC: RRR, no murmurs, rubs, gallops RESPIRATORY:  Clear to auscultation without rales, wheezing or rhonchi  ABDOMEN: Soft, non-tender, non-distended MUSCULOSKELETAL:  No edema; No deformity  SKIN: Warm and dry NEUROLOGIC:  Alert and oriented x 3 PSYCHIATRIC:  Normal affect   ASSESSMENT:    1. SVT (supraventricular tachycardia)   2. Primary hypertension   3. Type 2 diabetes mellitus with complication, without long-term current use of insulin (HCC)    PLAN:    In order of problems listed above:  SVT documented in ED. No clear triggers. Recommend switching to Toprol  XL 50 mg daily. Will stop chlorthalidone  to avoid hypotension. Discussed Valsalva maneuvers. Will arrange follow up in 3 months. If he continues to have frequent break through could consider for ablative therapy.            Medication Adjustments/Labs and Tests Ordered: Current medicines are reviewed at length with the patient today.  Concerns regarding medicines are outlined above.  No orders of the defined types were placed in this encounter.  Meds ordered this encounter  Medications   metoprolol  succinate (TOPROL -XL) 50 MG 24 hr tablet    Sig: Take 1 tablet (50 mg total) by mouth daily. Take with or immediately following a meal.    Dispense:  90 tablet    Refill:  3    Patient Instructions  Medication Instructions:  Stop Metoprolol  Tartrate  Start Metoprolol  Succinate 50 mg daily Continue all other medications  *If you need a refill on your cardiac medications before your next appointment, please call your pharmacy*  Lab Work: None ordered  Testing/Procedures: None ordered  Follow-Up: At [facility], you and your health needs are our priority.  As part of our continuing mission to provide you with exceptional heart care, our providers are all part of one team.  This team includes your primary Cardiologist (physician) and Advanced Practice Providers or APPs (Physician Assistants and Nurse Practitioners) who all work together to provide you with the care you need, when you need it.  Your next appointment:  Monday 2/2 at 1:30 pm    Provider:  Lum Louis NP   We recommend signing up for the patient portal called MyChart.  Sign up information is provided on this After Visit Summary.  MyChart is used to connect with patients for Virtual Visits (Telemedicine).  Patients are able to view lab/test results, encounter notes, upcoming appointments, etc.  Non-urgent messages can be sent to your provider as well.   To learn more about what you can do with MyChart, go to [email or web address].        Signed, Burlin Mcnair, MD  05/25/2024 3:41 PM    [facility]

## 2024-05-25 ENCOUNTER — Ambulatory Visit: Attending: Cardiology | Admitting: Cardiology

## 2024-05-25 ENCOUNTER — Encounter: Payer: Self-pay | Admitting: Cardiology

## 2024-05-25 VITALS — BP 110/70 | HR 83 | Ht 66.0 in | Wt 244.0 lb

## 2024-05-25 DIAGNOSIS — I471 Supraventricular tachycardia, unspecified: Secondary | ICD-10-CM

## 2024-05-25 DIAGNOSIS — E118 Type 2 diabetes mellitus with unspecified complications: Secondary | ICD-10-CM

## 2024-05-25 DIAGNOSIS — I10 Essential (primary) hypertension: Secondary | ICD-10-CM

## 2024-05-25 MED ORDER — METOPROLOL SUCCINATE ER 50 MG PO TB24: 50.0000 mg | ORAL_TABLET | Freq: Every day | ORAL | 3 refills | Status: AC

## 2024-05-25 NOTE — Patient Instructions (Addendum)
 Medication Instructions:  Stop Metoprolol  Tartrate  Start Metoprolol  Succinate 50 mg daily Continue all other medications  *If you need a refill on your cardiac medications before your next appointment, please call your pharmacy*  Lab Work: None ordered  Testing/Procedures: None ordered  Follow-Up: At [facility], you and your health needs are our priority.  As part of our continuing mission to provide you with exceptional heart care, our providers are all part of one team.  This team includes your primary Cardiologist (physician) and Advanced Practice Providers or APPs (Physician Assistants and Nurse Practitioners) who all work together to provide you with the care you need, when you need it.  Your next appointment:  Monday 2/2 at 1:30 pm    Provider:  Lum Louis NP   We recommend signing up for the patient portal called MyChart.  Sign up information is provided on this After Visit Summary.  MyChart is used to connect with patients for Virtual Visits (Telemedicine).  Patients are able to view lab/test results, encounter notes, upcoming appointments, etc.  Non-urgent messages can be sent to your provider as well.   To learn more about what you can do with MyChart, go to [email or web address].

## 2024-08-19 ENCOUNTER — Encounter: Payer: Self-pay | Admitting: *Deleted

## 2024-08-22 ENCOUNTER — Encounter: Payer: Self-pay | Admitting: Emergency Medicine

## 2024-08-22 ENCOUNTER — Ambulatory Visit: Admitting: Emergency Medicine

## 2024-08-22 VITALS — BP 122/80 | HR 83 | Ht 66.0 in | Wt 246.6 lb

## 2024-08-22 DIAGNOSIS — I10 Essential (primary) hypertension: Secondary | ICD-10-CM | POA: Diagnosis not present

## 2024-08-22 DIAGNOSIS — I471 Supraventricular tachycardia, unspecified: Secondary | ICD-10-CM

## 2024-08-22 NOTE — Progress Notes (Signed)
 " Cardiology Office Note:    Date:  08/22/2024  ID:  ARIAN MURLEY, DOB 1955-08-24, MRN [id number] PCP: Verdia Lombard, MD  [facility] Providers Cardiologist:  Peter Jordan, MD Cardiology APP:  Rana Lum CROME, NP       Patient Profile:       Chief Complaint: 68-month follow-up for SVT History of Present Illness:  ALEKZANDER CARDELL is a 69 y.o. male with visit-pertinent history of SVT, T2DM, hypertension, hyperlipidemia  He was seen in the emergency department in September 2025 with symptoms of tachycardia and lightheadedness.  He had SVT with a rate of 174 which converted spontaneously.  Patient had noted several episodes in the preceding month.  He was given metoprolol  to take as needed and later began metoprolol  tartrate 25 mg daily.  He is established with Dr. Jordan on 05/25/2024.  He had reported episodes of tachycardia for around 2 years.  Noted his symptoms are less pronounced since starting on metoprolol .  He was noted that he did wear an event monitor in September 2025 showing multiple episodes of SVT, report is scanned into his record.  He had a remote Myoview in 2010 that was normal.  Metoprolol  tartrate was switched to metoprolol  succinate 50 mg daily.  His chlorthalidone  was discontinued to avoid hypotension.  He was to follow-up in 3 months.   Discussed the use of AI scribe software for clinical note transcription with the patient, who gave verbal consent to proceed.  History of Present Illness DAVY WESTMORELAND is a 69 year old male with supraventricular tachycardia who presents for 3-month follow-up for SVT.  He has SVT with an emergency department visit in September. A heart monitor then showed 740 SVT episodes, longest 2 minutes 16 seconds, with a peak heart rate of 226 beats per minute.    Since last seen in office he has had no significant rapid heart rate, only brief minor episodes.  He has reported significant improvement in his palpitations.  He restarted  chlorthalidone  on his own after home blood pressures reached about 180/100 mmHg. With chlorthalidone  his blood pressure is about 120/80 mmHg. He now takes metoprolol  50 mg daily, losartan, and chlorthalidone  25 mg daily.  He is currently without chest pains or exertional symptoms.  He denies orthopnea, PND, weight gain, syncope or dizziness.  He takes Ozempic for diabetes and notes higher blood sugars recently. He previously used energy drinks, which he felt triggered SVT, and has stopped them.   Review of systems:  Please see the history of present illness. All other systems are reviewed and otherwise negative.      Studies Reviewed:    EKG Interpretation Date/Time:  Monday August 22 2024 13:38:58 EST Ventricular Rate:  78 PR Interval:  160 QRS Duration:  72 QT Interval:  372 QTC Calculation: 424 R Axis:   7  Text Interpretation: Normal sinus rhythm Normal ECG When compared with ECG of 02-Apr-2024 17:53, PREVIOUS ECG IS PRESENT Confirmed by Rana Lum [phone]) on 08/22/2024 1:39:51 PM    Zio 04/04/2024 - Scanned under CV procedures  Risk Assessment/Calculations:              Physical Exam:   VS:  BP 122/80   Pulse 83   Ht 5' 6 (1.676 m)   Wt 246 lb 9.6 oz (111.9 kg)   SpO2 98%   BMI 39.80 kg/m    Wt Readings from Last 3 Encounters:  08/22/24 246 lb 9.6 oz (111.9 kg)  05/25/24  244 lb (110.7 kg)  04/02/24 244 lb (110.7 kg)    GEN: Well nourished, well developed in no acute distress NECK: No JVD; No carotid bruits CARDIAC: RRR, no murmurs, rubs, gallops RESPIRATORY:  Clear to auscultation without rales, wheezing or rhonchi  ABDOMEN: Soft, non-tender, non-distended EXTREMITIES:  No edema; No acute deformity      Assessment and Plan:  Supraventricular tachycardia Documented in the ED on 03/2024 that spontaneously resolved Zio 03/2024 showed 740 SVT runs, longest lasting 2 minutes 16 seconds with average rate of 163 bpm - He has reported significant improvement  in his palpitations and has had no significant or prolonged rapid heart rates since last office visit (3 months).  He reports only brief or minor episodes that resolve rapidly - Overall his SVT is well-controlled.  If he has any significant breakthrough episodes can consider referral to EP for ablative therapy which we discussed today - He has stopped drinking daily energy drinks - We discussed vagal maneuvers  - Continue metoprolol  succinate 50 mg daily  Hypertension Blood pressure today is well-controlled at 122/80 - Briefly taken off of chlorthalidone  with the addition of metoprolol  however blood pressures were significantly elevated at that time and he was restarted -Continue chlorthalidone  25 mg daily, losartan 50 mg daily, and metoprolol  succinate 50 mg daily      Dispo:  Return in about 6 months (around 02/19/2025).  Signed, Lum LITTIE Louis, NP  "

## 2024-08-22 NOTE — Patient Instructions (Signed)
 Medication Instructions:  No medication changes were made during today's visit.  *If you need a refill on your cardiac medications before your next appointment, please call your pharmacy*  Lab Work: No labs were ordered during today's visit.  If you have labs (blood work) drawn today and your tests are completely normal, you will receive your results only by: MyChart Message (if you have MyChart) OR A paper copy in the mail If you have any lab test that is abnormal or we need to change your treatment, we will call you to review the results.  Testing/Procedures: No procedures were ordered during today's visit.   Follow-Up: At [facility], you and your health needs are our priority.  As part of our continuing mission to provide you with exceptional heart care, our providers are all part of one team.  This team includes your primary Cardiologist (physician) and Advanced Practice Providers or APPs (Physician Assistants and Nurse Practitioners) who all work together to provide you with the care you need, when you need it.  Your next appointment:   6 month(s)  Provider:   Dr. Jordan   We recommend signing up for the patient portal called MyChart.  Sign up information is provided on this After Visit Summary.  MyChart is used to connect with patients for Virtual Visits (Telemedicine).  Patients are able to view lab/test results, encounter notes, upcoming appointments, etc.  Non-urgent messages can be sent to your provider as well.   To learn more about what you can do with MyChart, go to [email or web address].   Other Instructions
